# Patient Record
Sex: Female | Born: 1975
Health system: Southern US, Community
[De-identification: ages and names within clinical notes are randomized; demographics above are authoritative.]

## PROBLEM LIST (undated history)

## (undated) DIAGNOSIS — E559 Vitamin D deficiency, unspecified: Secondary | ICD-10-CM

## (undated) DIAGNOSIS — E119 Type 2 diabetes mellitus without complications: Secondary | ICD-10-CM

## (undated) DIAGNOSIS — E785 Hyperlipidemia, unspecified: Secondary | ICD-10-CM

## (undated) DIAGNOSIS — E282 Polycystic ovarian syndrome: Secondary | ICD-10-CM

## (undated) DIAGNOSIS — I10 Essential (primary) hypertension: Secondary | ICD-10-CM

## (undated) HISTORY — PX: OTHER SURGICAL HISTORY: SHX169

## (undated) HISTORY — DX: Hyperlipidemia, unspecified: E78.5

## (undated) HISTORY — DX: Type 2 diabetes mellitus without complications: E11.9

## (undated) HISTORY — DX: Vitamin D deficiency, unspecified: E55.9

## (undated) HISTORY — DX: Polycystic ovarian syndrome: E28.2

## (undated) HISTORY — DX: Essential (primary) hypertension: I10

## (undated) HISTORY — PX: COLONOSCOPY: SHX174

---

## 1998-09-02 ENCOUNTER — Other Ambulatory Visit: Admission: RE | Admit: 1998-09-02 | Discharge: 1998-09-02 | Payer: Self-pay | Admitting: *Deleted

## 2000-01-03 ENCOUNTER — Other Ambulatory Visit: Admission: RE | Admit: 2000-01-03 | Discharge: 2000-01-03 | Payer: Self-pay | Admitting: *Deleted

## 2001-01-08 ENCOUNTER — Other Ambulatory Visit: Admission: RE | Admit: 2001-01-08 | Discharge: 2001-01-08 | Payer: Self-pay | Admitting: Obstetrics and Gynecology

## 2002-02-15 ENCOUNTER — Other Ambulatory Visit: Admission: RE | Admit: 2002-02-15 | Discharge: 2002-02-15 | Payer: Self-pay | Admitting: Obstetrics and Gynecology

## 2003-05-15 ENCOUNTER — Other Ambulatory Visit: Admission: RE | Admit: 2003-05-15 | Discharge: 2003-05-15 | Payer: Self-pay | Admitting: Obstetrics and Gynecology

## 2009-06-06 DIAGNOSIS — E282 Polycystic ovarian syndrome: Secondary | ICD-10-CM

## 2009-06-06 HISTORY — DX: Polycystic ovarian syndrome: E28.2

## 2016-05-06 DIAGNOSIS — I10 Essential (primary) hypertension: Secondary | ICD-10-CM

## 2016-05-06 HISTORY — DX: Essential (primary) hypertension: I10

## 2016-05-24 ENCOUNTER — Encounter: Payer: Self-pay | Admitting: Physician Assistant

## 2016-05-24 ENCOUNTER — Ambulatory Visit (INDEPENDENT_AMBULATORY_CARE_PROVIDER_SITE_OTHER): Payer: 59 | Admitting: Physician Assistant

## 2016-05-24 VITALS — BP 153/94 | HR 92 | Ht 66.0 in | Wt 276.4 lb

## 2016-05-24 DIAGNOSIS — R42 Dizziness and giddiness: Secondary | ICD-10-CM | POA: Diagnosis not present

## 2016-05-24 DIAGNOSIS — R000 Tachycardia, unspecified: Secondary | ICD-10-CM | POA: Insufficient documentation

## 2016-05-24 DIAGNOSIS — I10 Essential (primary) hypertension: Secondary | ICD-10-CM

## 2016-05-24 MED ORDER — METOPROLOL TARTRATE 50 MG PO TABS: 50.0000 mg | ORAL_TABLET | Freq: Two times a day (BID) | ORAL | 3 refills | Status: DC

## 2016-05-24 NOTE — Patient Instructions (Addendum)
Medication Instructions:  Your physician has recommended you make the following change in your medication:  1-INCREASE Metoprolol 50 mg by mouth twice daily  Labwork: NONE  Testing/Procedures: Your physician has requested that you have an echocardiogram. Echocardiography is a painless test that uses sound waves to create images of your heart. It provides your doctor with information about the size and shape of your heart and how well your heart's chambers and valves are working. This procedure takes approximately one hour. There are no restrictions for this procedure.  Follow-Up: Your physician wants you to follow-up in: 3 months with Dr. SwazilandJordan.   If you need a refill on your cardiac medications before your next appointment, please call your pharmacy.

## 2016-05-24 NOTE — Progress Notes (Signed)
Cardiology Office Note   Date:  05/24/2016   ID:  Cindy Hooper, DOB 06/24/1975, MRN [id number]  PCP:  Levon Hedger, MD  Cardiologist:  Ellis Parents, Dr Swaziland  Hadessah Grennan, PA-C   Chief Complaint  Patient presents with  . New Patient (Initial Visit)  . Tachycardia  . Dizziness    occassioanlly.    History of Present Illness: Cindy Hooper is a 40 y.o. female with a history of PCOS, Vit D deficiency, recently diagnosed with HTN  Cindy Hooper presents for evaluation of tachycardia.   Pt had medium high resting HR all her life, 80s was normal.   About a month ago, she developed intermittent light-headed feelings. These could come on while sitting at her computer, or with exertion. She walks the dogs 30" per day but is otherwise not active. She will occasionally get symptoms walking the dogs, but less than 50% of the time.   She saw her PCP because of the dizziness episodes and her HR was noted to be in the 90s-100s at rest, higher than usual for her. Her ECG showed sinus tach, HR 122. She was started on metoprolol and referred to cards. Her CBC, TSH and Free T4 were checked and were all normal.  After being started on the metoprolol, she went to [employer]. She did a great deal of walking, she did well with this. No DOE, baseline HR lower, no chest pain. Reasonable exercise tolerance.   She has continued to do well since being on the metoprolol. She has had no more dizzy episodes and her resting HR is lower. No exertional symptoms. No LE edema, no orthopnea or PND. She does not feel her HR increases abnormally when she stands up.    Past Medical History:  Diagnosis Date  . Benign essential HTN 05/2016   New diagnosis as of 05/2016  . PCOS (polycystic ovarian syndrome) 2011  . Vitamin D deficiency     Past Surgical History:  Procedure Laterality Date  . None      Current Outpatient Prescriptions  Medication Sig Dispense Refill  . Cholecalciferol (VITAMIN D PO)  Take by mouth.    . metFORMIN (GLUCOPHAGE) 500 MG tablet Take by mouth 2 (two) times daily with a meal.    . metoprolol succinate (TOPROL-XL) 25 MG 24 hr tablet Take 25 mg by mouth 2 (two) times daily.    . Multiple Vitamin (MULTIVITAMIN) tablet Take 1 tablet by mouth daily.     No current facility-administered medications for this visit.     Allergies:   Patient has no known allergies.    Social History:  The patient  reports that she has never smoked. She has never used smokeless tobacco. She reports that she does not drink alcohol or use drugs.   Family History:  The patient's family history includes Alcohol abuse (age of onset: 23) in her father; CAD (age of onset: 23) in her mother; CAD (age of onset: 6) in her maternal grandmother; CVA (age of onset: 33) in her maternal grandfather; Cancer - Colon in her maternal grandfather and maternal grandmother.    ROS:  Please see the history of present illness. All other systems are reviewed and negative.    PHYSICAL EXAM: VS:  BP (!) 153/94   Pulse 92   Ht 5\' 6"  (1.676 m)   Wt 276 lb 6.4 oz (125.4 kg)   BMI 44.61 kg/m  , BMI Body mass index is 44.61 kg/m. GEN: Well nourished, well developed,  female in no acute distress  HEENT: normal for age  Neck: no JVD, no carotid bruit, no masses Cardiac: RRR; no murmur, no rubs, or gallops Respiratory:  clear to auscultation bilaterally, normal work of breathing GI: soft, nontender, nondistended, + BS MS: no deformity or atrophy; no edema; distal pulses are 2+ in all 4 extremities   Skin: warm and dry, no rash Neuro:  Strength and sensation are intact Psych: euthymic mood, full affect   EKG:  EKG is ordered today. The ekg ordered today demonstrates SR with nonspecific T wave flattening   Recent Labs: No results found for requested labs within last 8760 hours.    Lipid Panel No results found for: CHOL, TRIG, HDL, CHOLHDL, VLDL, LDLCALC, LDLDIRECT   Wt Readings from Last 3  Encounters:  05/24/16 276 lb 6.4 oz (125.4 kg)     Other studies Reviewed: Additional studies/ records that were reviewed today include: [facility] records faxed over.  ASSESSMENT AND PLAN: Plan reviewed with Dr Dr Swaziland  1.  Tachycardia: Only ST seen, this is associated with PCOS, which she has. PCP started metoprolol, this has resolved symptoms. HR still elevated, will increase to 50 mg bid and hopefully this will help HR and BP control. She is encouraged to increase activity as tolerated. If sx recur, can get monitor, for now do not think it is needed.  2. Dizziness: possibly related to rapid HR, resolved on BB, follow. With FH SCD in her mother, ck echo.  Current medicines are reviewed at length with the patient today.  The patient does not have concerns regarding medicines.  The following changes have been made:  Increase BB  Labs/ tests ordered today include:  Orders Placed This Encounter  Procedures  . EKG 12-Lead  . ECHOCARDIOGRAM COMPLETE     Disposition:   FU with Dr Swaziland  Signed, Theodore Demark, PA-C  05/24/2016 4:21 PM    [facility] Phone: [phone]; Fax: [phone]  This note was written with the assistance of speech recognition software. Please excuse any transcriptional errors.

## 2016-06-15 ENCOUNTER — Other Ambulatory Visit: Payer: Self-pay

## 2016-06-15 ENCOUNTER — Ambulatory Visit (HOSPITAL_COMMUNITY): Payer: 59 | Attending: Cardiovascular Disease

## 2016-06-15 DIAGNOSIS — R42 Dizziness and giddiness: Secondary | ICD-10-CM | POA: Diagnosis not present

## 2016-06-15 DIAGNOSIS — R000 Tachycardia, unspecified: Secondary | ICD-10-CM

## 2016-07-06 ENCOUNTER — Other Ambulatory Visit: Payer: Self-pay | Admitting: Internal Medicine

## 2016-07-06 DIAGNOSIS — R600 Localized edema: Secondary | ICD-10-CM

## 2016-07-07 ENCOUNTER — Ambulatory Visit
Admission: RE | Admit: 2016-07-07 | Discharge: 2016-07-07 | Disposition: A | Discharge status: Home / Self Care | Payer: 59 | Source: Ambulatory Visit | Attending: Internal Medicine | Admitting: Internal Medicine

## 2016-07-07 DIAGNOSIS — R600 Localized edema: Secondary | ICD-10-CM

## 2016-08-22 ENCOUNTER — Ambulatory Visit (INDEPENDENT_AMBULATORY_CARE_PROVIDER_SITE_OTHER): Payer: 59 | Admitting: Cardiology

## 2016-08-22 ENCOUNTER — Encounter: Payer: Self-pay | Admitting: Cardiology

## 2016-08-22 VITALS — BP 126/78 | HR 87 | Ht 66.0 in | Wt 270.0 lb

## 2016-08-22 DIAGNOSIS — R000 Tachycardia, unspecified: Secondary | ICD-10-CM | POA: Diagnosis not present

## 2016-08-22 DIAGNOSIS — I10 Essential (primary) hypertension: Secondary | ICD-10-CM | POA: Diagnosis not present

## 2016-08-22 MED ORDER — METOPROLOL SUCCINATE ER 100 MG PO TB24: 100.0000 mg | ORAL_TABLET | Freq: Every day | ORAL | 3 refills | Status: DC

## 2016-08-22 NOTE — Patient Instructions (Signed)
Switch metoprolol to Toprol XL 100 mg daily  I will see you in one year.

## 2016-08-22 NOTE — Progress Notes (Signed)
Cardiology Office Note    Date:  08/22/2016   ID:  Major Haegele, DOB December 09, 1975, MRN [id number]  PCP:  Levon Hedger, MD  Cardiologist:  Jaivion Kingsley Swaz[place]and, MD    History of Present Illness:  Cindy Hooper is a 41 y.o. female seen for follow up of sinus tachycardia. She has a history of HTN, PCOS, and Vit D deficiency. Seen initially by Theodore Demark PA-C on 05/24/16. Noted to have sinus tachycardia on Ecg up to 122 bpm. Complained of intermittent dizziness. Started on metoprolol and symptoms resolved. Echo showed normal LV systolic function and grade 2 diastolic dysfunction.   On follow up today she is doing very well. No significant palpitations since metoprolol was increased. She is interested in trying Toprol XL for convenience. No dyspnea, dizziness, or chest pain.    Past Medical History:  Diagnosis Date  . Benign essential HTN 05/2016   New diagnosis as of 05/2016  . PCOS (polycystic ovarian syndrome) 2011  . Vitamin D deficiency     Past Surgical History:  Procedure Laterality Date  . None      Current Medications: Outpatient Medications Prior to Visit  Medication Sig Dispense Refill  . Cholecalciferol (VITAMIN D PO) Take by mouth.    . metFORMIN (GLUCOPHAGE) 500 MG tablet Take by mouth 2 (two) times daily with a meal.    . metoprolol (LOPRESSOR) 50 MG tablet Take 1 tablet (50 mg total) by mouth 2 (two) times daily. 180 tablet 3  . Multiple Vitamin (MULTIVITAMIN) tablet Take 1 tablet by mouth daily.     No facility-administered medications prior to visit.      Allergies:   Patient has no known allergies.   Social History   Social History  . Marital status: Unknown    Spouse name: N/A  . Number of children: N/A  . Years of education: N/A   Occupational History  . 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 Smoking status: Never Smoker  . Smokeless tobacco: Never Used  . Alcohol use No  . Drug use: No  . Sexual activity: Not Asked   Other Topics  Concern  . None   Social History Narrative   Pt lives with fiance in [place].     Family History:  The patient's family history includes Alcohol abuse (age of onset: 66) in her father; CAD (age of onset: 36) in her mother; CAD (age of onset: 84) in her maternal grandmother; CVA (age of onset: 61) in her maternal grandfather; Cancer - Colon in her maternal grandfather and maternal grandmother.   ROS:   Please see the history of present illness.    ROS All other systems reviewed and are negative.   PHYSICAL EXAM:   VS:  BP 126/78   Pulse 87   Ht 5\' 6"  (1.676 m)   Wt 270 lb (122.5 kg)   BMI 43.58 kg/m    GEN: Well nourished, obese, in no acute distress  HEENT: normal  Neck: no JVD, carotid bruits, or masses Cardiac: RRR; no murmurs, rubs, or gallops,no edema  Respiratory:  clear to auscultation bilaterally, normal work of breathing GI: soft, nontender, nondistended, + BS MS: no deformity or atrophy  Skin: warm and dry, no rash Neuro:  Alert and Oriented x 3, Strength and sensation are intact Psych: euthymic mood, full affect  Wt Readings from Last 3 Encounters:  08/22/16 270 lb (122.5 kg)  05/24/16 276 lb 6.4 oz (125.4 kg)  Studies/Labs Reviewed:   EKG:  EKG is not ordered today.  The ekg ordered today demonstrates N/A  Recent Labs: No results found for requested labs within last 8760 hours.   Lipid Panel No results found for: CHOL, TRIG, HDL, CHOLHDL, VLDL, LDLCALC, LDLDIRECT  Labs dated 09/21/15: cholesterol 192, triglycerides 101, HDL 39, LDL 133. CMET normal Dated 05/04/16: CBC and TFTs normal  Additional studies/ records that were reviewed today include:  Echo 06/15/16:Study Conclusions  - Left ventricle: The cavity size was normal. Wall thickness was   normal. Systolic function was normal. The estimated ejection   fraction was in the range of 60% to 65%. Wall motion was normal;   there were no regional wall motion abnormalities. Features are    consistent with a pseudonormal left ventricular filling pattern,   with concomitant abnormal relaxation and increased filling   pressure (grade 2 diastolic dysfunction). - Mitral valve: Mildly to moderately calcified annulus. - Left atrium: The atrium was mildly dilated.  ASSESSMENT:    1. Sinus tachycardia      PLAN:  In order of problems listed above:  1. Doing well on metoprolol. Will switch to Toprol XL 100 mg daily 2. BP is well controlled. 3. She is concerned about her strong family history of CAD. Since she is asymptomatic we discussed focusing on risk factor management. She will have lipids repeated this year. May consider going on a statin. Regular exercise.   I will follow up in one year.    Medication Adjustments/Labs and Tests Ordered: Current medicines are reviewed at length with the patient today.  Concerns regarding medicines are outlined above.  Medication changes, Labs and Tests ordered today are listed in the Patient Instructions below. There are no Patient Instructions on file for this visit.   Signed, Ercel Normoyle Swaziland, MD  08/22/2016 2:55 PM    [facility] [address] [place], [place], [address] [phone]

## 2016-12-28 ENCOUNTER — Other Ambulatory Visit: Payer: Self-pay | Admitting: Cardiology

## 2017-01-30 ENCOUNTER — Other Ambulatory Visit: Payer: Self-pay | Admitting: Internal Medicine

## 2017-01-30 DIAGNOSIS — Z1231 Encounter for screening mammogram for malignant neoplasm of breast: Secondary | ICD-10-CM

## 2017-02-13 ENCOUNTER — Ambulatory Visit
Admission: RE | Admit: 2017-02-13 | Discharge: 2017-02-13 | Disposition: A | Discharge status: Home / Self Care | Payer: 59 | Source: Ambulatory Visit | Attending: Internal Medicine | Admitting: Internal Medicine

## 2017-02-13 DIAGNOSIS — Z1231 Encounter for screening mammogram for malignant neoplasm of breast: Secondary | ICD-10-CM

## 2017-09-23 ENCOUNTER — Other Ambulatory Visit: Payer: Self-pay | Admitting: Cardiology

## 2017-10-23 ENCOUNTER — Other Ambulatory Visit: Payer: Self-pay | Admitting: Cardiology

## 2017-10-23 MED ORDER — METOPROLOL SUCCINATE ER 100 MG PO TB24: 100.0000 mg | ORAL_TABLET | Freq: Every day | ORAL | 2 refills | Status: DC

## 2017-10-23 NOTE — Telephone Encounter (Signed)
Metoprolol filled per the patient request. She has been informed to keep her August appointment with Dr. Swaziland.

## 2018-01-12 ENCOUNTER — Encounter: Payer: Self-pay | Admitting: Cardiology

## 2018-01-28 NOTE — Progress Notes (Signed)
Cardiology Office Note    Date:  02/01/2018   ID:  Cindy Hooper, DOB 04/01/1976, MRN [id number]  PCP:  Kendrick Ranch, MD  Cardiologist:  Peter Swaziland, MD    History of Present Illness:  Cindy Hooper is a 42 y.o. female seen for follow up of sinus tachycardia. She has a history of HTN, PCOS, and Vit D deficiency. Seen initially by Theodore Demark PA-C on 05/24/16. Noted to have sinus tachycardia on Ecg up to 122 bpm. Complained of intermittent dizziness. Started on metoprolol and symptoms resolved. Echo showed normal LV systolic function and grade 2 diastolic dysfunction.   On follow up today she is doing very well. She states she feels great and wants to stay on metoprolol.  No dyspnea, dizziness, or chest pain. She walks 1.5 miles/day. No other health concerns.     Past Medical History:  Diagnosis Date  . Benign essential HTN 05/2016   New diagnosis as of 05/2016  . PCOS (polycystic ovarian syndrome) 2011  . Vitamin D deficiency     Past Surgical History:  Procedure Laterality Date  . None      Current Medications: Outpatient Medications Prior to Visit  Medication Sig Dispense Refill  . Cholecalciferol (VITAMIN D PO) Take by mouth.    . metFORMIN (GLUCOPHAGE) 500 MG tablet Take by mouth 2 (two) times daily with a meal.    . Multiple Vitamin (MULTIVITAMIN) tablet Take 1 tablet by mouth daily.    . metoprolol succinate (TOPROL-XL) 100 MG 24 hr tablet Take 1 tablet (100 mg total) by mouth daily. Take with or immediately following a meal. 30 tablet 2  . metoprolol succinate (TOPROL-XL) 100 MG 24 hr tablet TAKE 1 TABLET BY MOUTH ONCE DAILY WITH MEALS 90 tablet 0   No facility-administered medications prior to visit.      Allergies:   Patient has no known allergies.   Social History   Socioeconomic History  . Marital status: Unknown    Spouse name: Not on file  . Number of children: Not on file  . Years of education: Not on file  . Highest education level: Not  on file  Occupational History  . Occupation: Charity fundraiser for [employer]  Social Needs  . Financial resource strain: Not on file  . Food insecurity:    Worry: Not on file    Inability: Not on file  . Transportation needs:    Medical: Not on file    Non-medical: Not on file  Tobacco Use  . Smoking status: Never Smoker  . Smokeless tobacco: Never Used  Substance and Sexual Activity  . Alcohol use: No  . Drug use: No  . Sexual activity: Not on file  Lifestyle  . Physical activity:    Days per week: Not on file    Minutes per session: Not on file  . Stress: Not on file  Relationships  . Social connections:    Talks on phone: Not on file    Gets together: Not on file    Attends religious service: Not on file    Active member of club or organization: Not on file    Attends meetings of clubs or organizations: Not on file    Relationship status: Not on file  Other Topics Concern  . Not on file  Social History Narrative   Pt lives with fiance in [place].     Family History:  The patient's family history includes Alcohol abuse (age of onset: 38) in her  father; CAD (age of onset: 58) in her mother; CAD (age of onset: 24) in her maternal grandmother; CVA (age of onset: 57) in her maternal grandfather; Cancer - Colon in her maternal grandfather and maternal grandmother.   ROS:   Please see the history of present illness.    ROS All other systems reviewed and are negative.   PHYSICAL EXAM:   VS:  BP 140/76 (BP Location: Right Arm, Patient Position: Sitting, Cuff Size: Large)   Pulse 86   Ht 5\' 6"  (1.676 m)   Wt 263 lb (119.3 kg)   BMI 42.45 kg/m    GEN: Well nourished, obese, in no acute distress  HEENT:  PERRL, EOMI, sclera are clear. Oropharynx is clear. NECK:  No jugular venous distention, carotid upstroke brisk and symmetric, no bruits, no thyromegaly or adenopathy LUNGS:  Clear to auscultation bilaterally CHEST:  Unremarkable HEART:  RRR,  PMI not displaced or sustained,S1 and S2  within normal limits, no S3, no S4: no clicks, no rubs, no murmurs ABD:  Soft, nontender. BS +, no masses or bruits. No hepatomegaly, no splenomegaly EXT:  2 + pulses throughout, no edema, no cyanosis no clubbing SKIN:  Warm and dry.  No rashes NEURO:  Alert and oriented x 3. Cranial nerves II through XII intact. PSYCH:  Cognitively intact    Wt Readings from Last 3 Encounters:  02/01/18 263 lb (119.3 kg)  08/22/16 270 lb (122.5 kg)  05/24/16 276 lb 6.4 oz (125.4 kg)      Studies/Labs Reviewed:   EKG:  EKG is not ordered today.  The ekg ordered today demonstrates N/A  Recent Labs: No results found for requested labs within last 8760 hours.   Lipid Panel No results found for: CHOL, TRIG, HDL, CHOLHDL, VLDL, LDLCALC, LDLDIRECT  Labs dated 09/21/15: cholesterol 192, triglycerides 101, HDL 39, LDL 133. CMET normal Dated 05/04/16: CBC and TFTs normal Dated 01/30/17; cholesterol 194, triglycerides 121, HDL 37, LDL 133. Hgb, chemistries and TSH normal.  Ecg today shows NSR rate 86. Nonspecific TWA. I have personally reviewed and interpreted this study.   Additional studies/ records that were reviewed today include:  Echo 06/15/16:Study Conclusions  - Left ventricle: The cavity size was normal. Wall thickness was   normal. Systolic function was normal. The estimated ejection   fraction was in the range of 60% to 65%. Wall motion was normal;   there were no regional wall motion abnormalities. Features are   consistent with a pseudonormal left ventricular filling pattern,   with concomitant abnormal relaxation and increased filling   pressure (grade 2 diastolic dysfunction). - Mitral valve: Mildly to moderately calcified annulus. - Left atrium: The atrium was mildly dilated.  ASSESSMENT:    1. Sinus tachycardia      PLAN:  In order of problems listed above:  1. Doing very well on metoprolol. Toprol XL is refilled. 2. Mild hypercholesterolemia. Does not want to take  medication. Agree- focus on lifestyle modification.  I will follow up in one year.    Medication Adjustments/Labs and Tests Ordered: Current medicines are reviewed at length with the patient today.  Concerns regarding medicines are outlined above.  Medication changes, Labs and Tests ordered today are listed in the Patient Instructions below. Patient Instructions  Continue your current therapy  Follow up in one year    Signed, Peter Swaziland, MD  02/01/2018 6:01 PM    [facility] [address] [place], [place], [address] [phone]

## 2018-02-01 ENCOUNTER — Encounter: Payer: Self-pay | Admitting: Cardiology

## 2018-02-01 ENCOUNTER — Ambulatory Visit (INDEPENDENT_AMBULATORY_CARE_PROVIDER_SITE_OTHER): Payer: 59 | Admitting: Cardiology

## 2018-02-01 VITALS — BP 140/76 | HR 86 | Ht 66.0 in | Wt 263.0 lb

## 2018-02-01 DIAGNOSIS — R000 Tachycardia, unspecified: Secondary | ICD-10-CM

## 2018-02-01 MED ORDER — METOPROLOL SUCCINATE ER 100 MG PO TB24: 100.0000 mg | ORAL_TABLET | Freq: Every day | ORAL | 3 refills | Status: DC

## 2018-02-01 NOTE — Patient Instructions (Addendum)
Continue your current therapy  Follow up in one year 

## 2019-02-24 ENCOUNTER — Other Ambulatory Visit: Payer: Self-pay | Admitting: Cardiology

## 2019-02-26 ENCOUNTER — Other Ambulatory Visit: Payer: Self-pay | Admitting: Obstetrics & Gynecology

## 2019-02-26 DIAGNOSIS — Z1231 Encounter for screening mammogram for malignant neoplasm of breast: Secondary | ICD-10-CM

## 2019-04-10 ENCOUNTER — Other Ambulatory Visit: Payer: Self-pay

## 2019-04-10 ENCOUNTER — Ambulatory Visit
Admission: RE | Admit: 2019-04-10 | Discharge: 2019-04-10 | Disposition: A | Discharge status: Home / Self Care | Payer: 59 | Source: Ambulatory Visit | Attending: Obstetrics & Gynecology | Admitting: Obstetrics & Gynecology

## 2019-04-10 DIAGNOSIS — Z1231 Encounter for screening mammogram for malignant neoplasm of breast: Secondary | ICD-10-CM

## 2019-04-21 NOTE — Progress Notes (Signed)
Cardiology Office Note    Date:  04/23/2019   ID:  Cindy Hooper, DOB 12/02/1975, MRN [id number]  PCP:  Kendrick Ranch, MD  Cardiologist:  Dalene Robards [place], MD    History of Present Illness:  Cindy Hooper is a 43 y.o. female seen for follow up of sinus tachycardia. She has a history of HTN, PCOS, and Vit D deficiency. Seen initially by Theodore Demark PA-C on 05/24/16. Noted to have sinus tachycardia on Ecg up to 122 bpm. Complained of intermittent dizziness. Started on metoprolol and symptoms resolved. Echo showed normal LV systolic function and grade 2 diastolic dysfunction.   On follow up today she is doing very well. She states she feels great and wants to stay on metoprolol.  No dyspnea, dizziness, or chest pain. She does walk her dogs- 2 rescues. No other health concerns.     Past Medical History:  Diagnosis Date  . Benign essential HTN 05/2016   New diagnosis as of 05/2016  . PCOS (polycystic ovarian syndrome) 2011  . Vitamin D deficiency     Past Surgical History:  Procedure Laterality Date  . None      Current Medications: Outpatient Medications Prior to Visit  Medication Sig Dispense Refill  . Cholecalciferol (VITAMIN D PO) Take by mouth.    . metFORMIN (GLUCOPHAGE) 500 MG tablet Take by mouth 2 (two) times daily with a meal.    . Multiple Vitamin (MULTIVITAMIN) tablet Take 1 tablet by mouth daily.    . metoprolol succinate (TOPROL-XL) 100 MG 24 hr tablet TAKE 1 TABLET BY MOUTH ONCE DAILY WITH MEALS OR IMMEDIATELY FOLLOWING A MEAL **PATIENT NEEDS TO KEEP FOLLOW UP APPOINTMENT** 30 tablet 1   No facility-administered medications prior to visit.      Allergies:   Patient has no known allergies.   Social History   Socioeconomic History  . Marital status: Unknown    Spouse name: Not on file  . Number of children: Not on file  . Years of education: Not on file  . Highest education level: Not on file  Occupational History  . Occupation: Charity fundraiser for [employer]   Social Needs  . Financial resource strain: Not on file  . Food insecurity    Worry: Not on file    Inability: Not on file  . Transportation needs    Medical: Not on file    Non-medical: Not on file  Tobacco Use  . Smoking status: Never Smoker  . Smokeless tobacco: Never Used  Substance and Sexual Activity  . Alcohol use: No  . Drug use: No  . Sexual activity: Not on file  Lifestyle  . Physical activity    Days per week: Not on file    Minutes per session: Not on file  . Stress: Not on file  Relationships  . Social Musician on phone: Not on file    Gets together: Not on file    Attends religious service: Not on file    Active member of club or organization: Not on file    Attends meetings of clubs or organizations: Not on file    Relationship status: Not on file  Other Topics Concern  . Not on file  Social History Narrative   Pt lives with fiance in [place].     Family History:  The patient's family history includes Alcohol abuse (age of onset: 58) in her father; CAD (age of onset: 28) in her mother; CAD (age of onset: 57)  in her maternal grandmother; CVA (age of onset: 33) in her maternal grandfather; Cancer - Colon in her maternal grandfather and maternal grandmother.   ROS:   Please see the history of present illness.    ROS All other systems reviewed and are negative.   PHYSICAL EXAM:   VS:  BP 126/82   Pulse 98   Temp 98.1 F (36.7 C)   Ht 5\' 6"  (1.676 m)   Wt 263 lb (119.3 kg)   SpO2 97%   BMI 42.45 kg/m    GEN: Well nourished, obese, in no acute distress  HEENT:  PERRL, EOMI, sclera are clear. Oropharynx is clear. NECK:  No jugular venous distention, carotid upstroke brisk and symmetric, no bruits, no thyromegaly or adenopathy LUNGS:  Clear to auscultation bilaterally CHEST:  Unremarkable HEART:  RRR,  PMI not displaced or sustained,S1 and S2 within normal limits, no S3, no S4: no clicks, no rubs, no murmurs ABD:  Soft, nontender. BS +,  no masses or bruits. No hepatomegaly, no splenomegaly EXT:  2 + pulses throughout, no edema, no cyanosis no clubbing SKIN:  Warm and dry.  No rashes NEURO:  Alert and oriented x 3. Cranial nerves II through XII intact. PSYCH:  Cognitively intact    Wt Readings from Last 3 Encounters:  04/23/19 263 lb (119.3 kg)  02/01/18 263 lb (119.3 kg)  08/22/16 270 lb (122.5 kg)      Studies/Labs Reviewed:   EKG:  EKG is  ordered today.  The ekg ordered today demonstrates NSR rate 98. Nonspecific TWA.   Recent Labs: No results found for requested labs within last 8760 hours.   Lipid Panel No results found for: CHOL, TRIG, HDL, CHOLHDL, VLDL, LDLCALC, LDLDIRECT  Labs dated 09/21/15: cholesterol 192, triglycerides 101, HDL 39, LDL 133. CMET normal Dated 05/04/16: CBC and TFTs normal Dated 01/30/17; cholesterol 194, triglycerides 121, HDL 37, LDL 133. Hgb, chemistries and TSH normal. Dated 06/18/18: cholesterol 177, triglycerides 86, HDL 38, LDL 122. CBC, CMET, TSH normal  Ecg today shows NSR rate 86. Nonspecific TWA. I have personally reviewed and interpreted this study.   Additional studies/ records that were reviewed today include:  Echo 06/15/16:Study Conclusions  - Left ventricle: The cavity size was normal. Wall thickness was   normal. Systolic function was normal. The estimated ejection   fraction was in the range of 60% to 65%. Wall motion was normal;   there were no regional wall motion abnormalities. Features are   consistent with a pseudonormal left ventricular filling pattern,   with concomitant abnormal relaxation and increased filling   pressure (grade 2 diastolic dysfunction). - Mitral valve: Mildly to moderately calcified annulus. - Left atrium: The atrium was mildly dilated.  ASSESSMENT:    1. Sinus tachycardia   2. PCOS (polycystic ovarian syndrome)      PLAN:  In order of problems listed above:  1. Doing very well on metoprolol. Toprol XL is refilled. 2. Mild  hypercholesterolemia. Improved from before. Continue lifestyle modification 3. PCOS on metformin  I will follow up in 1-2 years.    Medication Adjustments/Labs and Tests Ordered: Current medicines are reviewed at length with the patient today.  Concerns regarding medicines are outlined above.  Medication changes, Labs and Tests ordered today are listed in the Patient Instructions below. There are no Patient Instructions on file for this visit.   Signed, Zaydon Kinser [place], MD  04/23/2019 3:45 PM    [facility] [address] [place],  [place], [address] [phone]

## 2019-04-23 ENCOUNTER — Other Ambulatory Visit: Payer: Self-pay

## 2019-04-23 ENCOUNTER — Ambulatory Visit (INDEPENDENT_AMBULATORY_CARE_PROVIDER_SITE_OTHER): Payer: 59 | Admitting: Cardiology

## 2019-04-23 ENCOUNTER — Encounter: Payer: Self-pay | Admitting: Cardiology

## 2019-04-23 VITALS — BP 126/82 | HR 98 | Temp 98.1°F | Ht 66.0 in | Wt 263.0 lb

## 2019-04-23 DIAGNOSIS — R000 Tachycardia, unspecified: Secondary | ICD-10-CM | POA: Diagnosis not present

## 2019-04-23 DIAGNOSIS — E282 Polycystic ovarian syndrome: Secondary | ICD-10-CM | POA: Diagnosis not present

## 2019-04-23 MED ORDER — METOPROLOL SUCCINATE ER 100 MG PO TB24: 100.0000 mg | ORAL_TABLET | Freq: Every day | ORAL | 3 refills | Status: DC

## 2019-04-23 NOTE — Addendum Note (Signed)
Addended by: Kathyrn Lass on: 04/23/2019 03:49 PM   Modules accepted: Orders

## 2020-03-30 ENCOUNTER — Other Ambulatory Visit: Payer: Self-pay | Admitting: Cardiology

## 2020-05-14 ENCOUNTER — Other Ambulatory Visit: Payer: Self-pay | Admitting: Family Medicine

## 2020-05-14 DIAGNOSIS — Z1231 Encounter for screening mammogram for malignant neoplasm of breast: Secondary | ICD-10-CM

## 2020-06-02 DIAGNOSIS — Z1231 Encounter for screening mammogram for malignant neoplasm of breast: Secondary | ICD-10-CM

## 2020-06-10 NOTE — Progress Notes (Signed)
Cardiology Office Note    Date:  06/12/2020   ID:  Cindy Hooper, DOB 1975-10-18, MRN [id number]  PCP:  Deatra James, MD  Cardiologist:  Jameson Morrow [place], MD    History of Present Illness:  Cindy Hooper is a 45 y.o. female seen for follow up of sinus tachycardia. She has a history of HTN, PCOS, and Vit D deficiency. Seen initially by Theodore Demark PA-C on 05/24/16. Noted to have sinus tachycardia on Ecg up to 122 bpm. Complained of intermittent dizziness. Started on metoprolol and symptoms resolved. Echo showed normal LV systolic function and grade 2 diastolic dysfunction.   On follow up today she is doing very well. She states she feels great and the Toprol is working well.   No dyspnea, dizziness, or chest pain. She walks regularly. She does have a family history of premature CAD with mother dying of sudden death at age 27 and father died of an MI at age 32.     Past Medical History:  Diagnosis Date  . Benign essential HTN 05/2016   New diagnosis as of 05/2016  . PCOS (polycystic ovarian syndrome) 2011  . Vitamin D deficiency     Past Surgical History:  Procedure Laterality Date  . None      Current Medications: Outpatient Medications Prior to Visit  Medication Sig Dispense Refill  . Cholecalciferol (VITAMIN D PO) Take by mouth.    . metFORMIN (GLUCOPHAGE) 500 MG tablet Take by mouth 2 (two) times daily with a meal.    . metoprolol succinate (TOPROL-XL) 100 MG 24 hr tablet TAKE 1 TABLET BY MOUTH ONCE DAILY AFTER A MEAL 90 tablet 1  . Multiple Vitamin (MULTIVITAMIN) tablet Take 1 tablet by mouth daily.     No facility-administered medications prior to visit.     Allergies:   Patient has no known allergies.   Social History   Socioeconomic History  . Marital status: Unknown    Spouse name: Not on file  . Number of children: Not on file  . Years of education: Not on file  . Highest education level: Not on file  Occupational History  . Occupation: Charity fundraiser for [employer]  Tobacco  Use  . Smoking status: Never Smoker  . Smokeless tobacco: Never Used  Vaping Use  . Vaping Use: Never used  Substance and Sexual Activity  . Alcohol use: No  . Drug use: No  . Sexual activity: Not on file  Other Topics Concern  . Not on file  Social History Narrative   Pt lives with fiance in [place].   Social Determinants of Health   Financial Resource Strain: Not on file  Food Insecurity: Not on file  Transportation Needs: Not on file  Physical Activity: Not on file  Stress: Not on file  Social Connections: Not on file     Family History:  The patient's family history includes Alcohol abuse (age of onset: 32) in her father; CAD (age of onset: 24) in her mother; CAD (age of onset: 84) in her maternal grandmother; CVA (age of onset: 58) in her maternal grandfather; Cancer - Colon in her maternal grandfather and maternal grandmother.   ROS:   Please see the history of present illness.    ROS All other systems reviewed and are negative.   PHYSICAL EXAM:   VS:  BP 132/72   Pulse 94   Ht 5\' 6"  (1.676 m)   Wt 267 lb 12.8 oz (121.5 kg)   BMI 43.22 kg/m  GEN: Well nourished, obese, in no acute distress  HEENT:  PERRL, EOMI, sclera are clear. Oropharynx is clear. NECK:  No jugular venous distention, carotid upstroke brisk and symmetric, no bruits, no thyromegaly or adenopathy LUNGS:  Clear to auscultation bilaterally CHEST:  Unremarkable HEART:  RRR,  PMI not displaced or sustained,S1 and S2 within normal limits, no S3, no S4: no clicks, no rubs, no murmurs ABD:  Soft, nontender. BS +, no masses or bruits. No hepatomegaly, no splenomegaly EXT:  2 + pulses throughout, no edema, no cyanosis no clubbing SKIN:  Warm and dry.  No rashes NEURO:  Alert and oriented x 3. Cranial nerves II through XII intact. PSYCH:  Cognitively intact    Wt Readings from Last 3 Encounters:  06/12/20 267 lb 12.8 oz (121.5 kg)  04/23/19 263 lb (119.3 kg)  02/01/18 263 lb (119.3 kg)       Studies/Labs Reviewed:   EKG:  EKG is  ordered today.  The ekg ordered today demonstrates NSR rate 94. Nonspecific TWA.   Recent Labs: No results found for requested labs within last 8760 hours.   Lipid Panel No results found for: CHOL, TRIG, HDL, CHOLHDL, VLDL, LDLCALC, LDLDIRECT  Labs dated 09/21/15: cholesterol 192, triglycerides 101, HDL 39, LDL 133. CMET normal Dated 05/04/16: CBC and TFTs normal Dated 01/30/17; cholesterol 194, triglycerides 121, HDL 37, LDL 133. Hgb, chemistries and TSH normal. Dated 06/18/18: cholesterol 177, triglycerides 86, HDL 38, LDL 122. CBC, CMET, TSH normal Dated 08/26/19: cholesterol 214, triglycerides 123, HDL 41, LDL 151. CBC, CMET and TSH normal.   Additional studies/ records that were reviewed today include:  Echo 06/15/16:Study Conclusions  - Left ventricle: The cavity size was normal. Wall thickness was   normal. Systolic function was normal. The estimated ejection   fraction was in the range of 60% to 65%. Wall motion was normal;   there were no regional wall motion abnormalities. Features are   consistent with a pseudonormal left ventricular filling pattern,   with concomitant abnormal relaxation and increased filling   pressure (grade 2 diastolic dysfunction). - Mitral valve: Mildly to moderately calcified annulus. - Left atrium: The atrium was mildly dilated.  ASSESSMENT:    1. Sinus tachycardia   2. PCOS (polycystic ovarian syndrome)   3. Hypercholesteremia   4. Family history of premature CAD      PLAN:  In order of problems listed above:  1. Doing very well on metoprolol. Toprol XL is refilled. 2. Hypercholesterolemia. We discussed whether or not she should go on statin therapy. She has no known CAD but family history is concerning. I have recommended a coronary CT calcium score. If her score is zero then her 10 year CV risk is very low and we can forgo statin therapy. If she does have coronary calcification then this would  argue for statin therapy and more aggressive lipid lowering.  3. PCOS on metformin- stable.   I will follow up in 1 years.    Medication Adjustments/Labs and Tests Ordered: Current medicines are reviewed at length with the patient today.  Concerns regarding medicines are outlined above.  Medication changes, Labs and Tests ordered today are listed in the Patient Instructions below. There are no Patient Instructions on file for this visit.   Signed, Lorilyn Laitinen [place], MD  06/12/2020 4:05 PM    [facility] [address], [place], [place], [address] [phone]

## 2020-06-12 ENCOUNTER — Other Ambulatory Visit: Payer: Self-pay

## 2020-06-12 ENCOUNTER — Ambulatory Visit (INDEPENDENT_AMBULATORY_CARE_PROVIDER_SITE_OTHER): Payer: 59 | Admitting: Cardiology

## 2020-06-12 ENCOUNTER — Encounter: Payer: Self-pay | Admitting: Cardiology

## 2020-06-12 VITALS — BP 132/72 | HR 94 | Ht 66.0 in | Wt 267.8 lb

## 2020-06-12 DIAGNOSIS — R000 Tachycardia, unspecified: Secondary | ICD-10-CM | POA: Diagnosis not present

## 2020-06-12 DIAGNOSIS — E7800 Pure hypercholesterolemia, unspecified: Secondary | ICD-10-CM | POA: Diagnosis not present

## 2020-06-12 DIAGNOSIS — E282 Polycystic ovarian syndrome: Secondary | ICD-10-CM

## 2020-06-12 DIAGNOSIS — Z8249 Family history of ischemic heart disease and other diseases of the circulatory system: Secondary | ICD-10-CM

## 2020-06-12 MED ORDER — METOPROLOL SUCCINATE ER 100 MG PO TB24: 100.0000 mg | ORAL_TABLET | Freq: Every day | ORAL | 3 refills | Status: DC

## 2020-06-12 NOTE — Patient Instructions (Signed)
Medication Instructions:  Continue same medications *If you need a refill on your cardiac medications before your next appointment, please call your pharmacy*   Lab Work: None ordered   Testing/Procedures: Coronary Calcium Score   Follow-Up: At [facility], you and your health needs are our priority.  As part of our continuing mission to provide you with exceptional heart care, we have created designated Provider Care Teams.  These Care Teams include your primary Cardiologist (physician) and Advanced Practice Providers (APPs -  Physician Assistants and Nurse Practitioners) who all work together to provide you with the care you need, when you need it.  We recommend signing up for the patient portal called "MyChart".  Sign up information is provided on this After Visit Summary.  MyChart is used to connect with patients for Virtual Visits (Telemedicine).  Patients are able to view lab/test results, encounter notes, upcoming appointments, etc.  Non-urgent messages can be sent to your provider as well.   To learn more about what you can do with MyChart, go to [email or web address].    Your next appointment:  1 year   Call in Oct to schedule Jan appointment    The format for your next appointment:  Office    Provider:  Dr.Jordan

## 2020-06-18 ENCOUNTER — Other Ambulatory Visit: Payer: Self-pay

## 2020-06-18 ENCOUNTER — Ambulatory Visit (INDEPENDENT_AMBULATORY_CARE_PROVIDER_SITE_OTHER)
Admission: RE | Admit: 2020-06-18 | Discharge: 2020-06-18 | Disposition: A | Discharge status: Home / Self Care | Payer: Self-pay | Source: Ambulatory Visit | Attending: Cardiology | Admitting: Cardiology

## 2020-06-18 DIAGNOSIS — Z8249 Family history of ischemic heart disease and other diseases of the circulatory system: Secondary | ICD-10-CM

## 2020-06-18 DIAGNOSIS — E7800 Pure hypercholesterolemia, unspecified: Secondary | ICD-10-CM

## 2020-06-18 DIAGNOSIS — R000 Tachycardia, unspecified: Secondary | ICD-10-CM

## 2020-06-18 DIAGNOSIS — E282 Polycystic ovarian syndrome: Secondary | ICD-10-CM

## 2020-08-17 ENCOUNTER — Ambulatory Visit
Admission: RE | Admit: 2020-08-17 | Discharge: 2020-08-17 | Disposition: A | Discharge status: Home / Self Care | Payer: 59 | Source: Ambulatory Visit | Attending: Family Medicine | Admitting: Family Medicine

## 2020-08-17 ENCOUNTER — Other Ambulatory Visit: Payer: Self-pay

## 2020-08-17 DIAGNOSIS — Z1231 Encounter for screening mammogram for malignant neoplasm of breast: Secondary | ICD-10-CM

## 2021-04-16 ENCOUNTER — Other Ambulatory Visit: Payer: Self-pay

## 2021-04-16 ENCOUNTER — Encounter: Payer: Self-pay | Admitting: Plastic Surgery

## 2021-04-16 ENCOUNTER — Ambulatory Visit (INDEPENDENT_AMBULATORY_CARE_PROVIDER_SITE_OTHER): Payer: 59 | Admitting: Plastic Surgery

## 2021-04-16 DIAGNOSIS — L989 Disorder of the skin and subcutaneous tissue, unspecified: Secondary | ICD-10-CM | POA: Diagnosis not present

## 2021-04-16 DIAGNOSIS — L719 Rosacea, unspecified: Secondary | ICD-10-CM

## 2021-04-16 NOTE — Progress Notes (Signed)
Patient ID: Cindy Hooper, female    DOB: 1975-06-08, 45 y.o.   MRN: [id number]   Chief Complaint  Patient presents with   Advice Only   Skin Problem    The patient is a 45 year old female here for evaluation of her face.  She has several changing skin lesions.  She says that they have been there for several years and they are starting to get larger.  She is concerned about the change.  She does not have a family history or personal history of skin cancer.  She also has pretty severe rosacea and is interested in treatment.  I do not see any other areas of concern.  She is otherwise healthy.  She has had laser treatment in the past for her tourism secondary to polycystic ovary disease.  The largest lesion is in the left periorbital area medially.  It is about 7 mm in size and is flesh-colored and raised.  In the right perioral lesion is 5 to 6 mm, raised and flesh-colored.  The lesion on the left chin is raised with hyperpigmentation in the center.  Nothing makes these areas better.   Review of Systems  Constitutional: Negative.   Eyes: Negative.   Respiratory: Negative.    Cardiovascular: Negative.   Gastrointestinal: Negative.   Endocrine: Negative.   Genitourinary: Negative.   Musculoskeletal: Negative.   Skin:  Positive for color change.  Psychiatric/Behavioral: Negative.     Past Medical History:  Diagnosis Date   Benign essential HTN 05/2016   New diagnosis as of 05/2016   PCOS (polycystic ovarian syndrome) 2011   Vitamin D deficiency     Past Surgical History:  Procedure Laterality Date   None        Current Outpatient Medications:    Cholecalciferol (VITAMIN D PO), Take by mouth., Disp: , Rfl:    metFORMIN (GLUCOPHAGE) 500 MG tablet, Take by mouth 2 (two) times daily with a meal., Disp: , Rfl:    metoprolol succinate (TOPROL-XL) 100 MG 24 hr tablet, Take 1 tablet (100 mg total) by mouth daily. Take with or immediately following a meal., Disp: 90 tablet, Rfl: 3    Multiple Vitamin (MULTIVITAMIN) tablet, Take 1 tablet by mouth daily., Disp: , Rfl:    tretinoin (RETIN-A) 0.025 % cream, SMARTSIG:Topical Every Evening, Disp: , Rfl:    Objective:   Vitals:   04/16/21 1114  BP: 120/75  Pulse: (!) 105  SpO2: 98%    Physical Exam Vitals and nursing note reviewed.  Constitutional:      Appearance: Normal appearance.  HENT:     Head: Normocephalic and atraumatic.  Cardiovascular:     Rate and Rhythm: Normal rate.     Pulses: Normal pulses.  Pulmonary:     Effort: Pulmonary effort is normal.  Musculoskeletal:        General: No swelling or deformity.  Skin:    Capillary Refill: Capillary refill takes less than 2 seconds.     Coloration: Skin is not jaundiced.     Findings: Lesion present. No bruising.  Neurological:     Mental Status: She is alert and oriented to person, place, and time.    Assessment & Plan:  Changing skin lesion  Rosacea  I recommend excision of the chin changing skin lesion and sent to pathology.  If it is negative then I would feel comfortable excising the other 2 areas for fee-for-service.  I will also send her information for the BBL laser  for her rosacea.   Pictures were obtained of the patient and placed in the chart with the patient's or guardian's permission.   Alena Bills Montrell Cessna, DO

## 2021-06-20 ENCOUNTER — Encounter: Payer: Self-pay | Admitting: Cardiology

## 2021-06-21 MED ORDER — METOPROLOL SUCCINATE ER 100 MG PO TB24: 100.0000 mg | ORAL_TABLET | Freq: Every day | ORAL | 0 refills | Status: DC

## 2021-06-22 ENCOUNTER — Other Ambulatory Visit: Payer: Self-pay | Admitting: Cardiology

## 2021-06-25 ENCOUNTER — Telehealth: Payer: Self-pay | Admitting: Cardiology

## 2021-06-25 ENCOUNTER — Telehealth: Payer: Self-pay

## 2021-06-25 MED ORDER — METOPROLOL SUCCINATE ER 100 MG PO TB24: 100.0000 mg | ORAL_TABLET | Freq: Every day | ORAL | 0 refills | Status: DC

## 2021-06-25 NOTE — Telephone Encounter (Signed)
° ° °*  STAT* If patient is at the pharmacy, call can be transferred to refill team.   1. Which medications need to be refilled? (please list name of each medication and dose if known)  metoprolol succinate (TOPROL-XL) 100 MG 24 hr tablet  2. Which pharmacy/location (including street and city if local pharmacy) is medication to be sent to? [facility] - [place], [place] - [address]  3. Do they need a 30 day or 90 day supply? 30 with refills    Patient does not use [facility] any longer. The [facility] but there was a technical error so a new rx needs sent

## 2021-06-25 NOTE — Telephone Encounter (Signed)
Received a call from pharmacist with [facility] [address].Advised ok to refill Metoprolol.Advised patient needs to keep appointment already scheduled with Dr.Jordan 08/17/21.

## 2021-07-27 ENCOUNTER — Other Ambulatory Visit (HOSPITAL_COMMUNITY)
Admission: RE | Admit: 2021-07-27 | Discharge: 2021-07-27 | Disposition: A | Discharge status: Home / Self Care | Payer: 59 | Source: Ambulatory Visit | Attending: Plastic Surgery | Admitting: Plastic Surgery

## 2021-07-27 ENCOUNTER — Other Ambulatory Visit: Payer: Self-pay

## 2021-07-27 ENCOUNTER — Other Ambulatory Visit: Payer: Self-pay | Admitting: Family Medicine

## 2021-07-27 ENCOUNTER — Encounter: Payer: Self-pay | Admitting: Plastic Surgery

## 2021-07-27 ENCOUNTER — Ambulatory Visit (INDEPENDENT_AMBULATORY_CARE_PROVIDER_SITE_OTHER): Payer: 59 | Admitting: Plastic Surgery

## 2021-07-27 VITALS — BP 129/84 | HR 95

## 2021-07-27 DIAGNOSIS — L989 Disorder of the skin and subcutaneous tissue, unspecified: Secondary | ICD-10-CM

## 2021-07-27 DIAGNOSIS — Z1231 Encounter for screening mammogram for malignant neoplasm of breast: Secondary | ICD-10-CM

## 2021-07-27 NOTE — Progress Notes (Signed)
Procedure Note  Preoperative Dx: changing skin lesion chin  Postoperative Dx: Same  Procedure: Excision of changing skin lesion of chin 9 mm  Anesthesia: Lidocaine 1% with 1:100,000 epinephrine   Description of Procedure: Risks and complications were explained to the patient.  Consent was confirmed and the patient understands the risks and benefits.  The potential complications and alternatives were explained and the patient consents.  The patient expressed understanding the option of not having the procedure and the risks of a scar.  Time out was called and all information was confirmed to be correct.    The area was prepped and drapped.  Lidocaine 1% with epinepherine was injected in the subcutaneous area.  After waiting several minutes for the local to take affect a #15 blade was used to excise the area in an eliptical pattern.  The skin edges were reapproximated with 6-0 Monocryl subcuticular running closure.  A dressing was applied.  The patient was given instructions on how to care for the area and a follow up appointment.  Cindy Hooper tolerated the procedure well and there were no complications. The specimen sent to pathology.

## 2021-07-29 LAB — SURGICAL PATHOLOGY

## 2021-07-30 ENCOUNTER — Telehealth: Payer: Self-pay | Admitting: *Deleted

## 2021-07-30 NOTE — Telephone Encounter (Signed)
-----   Message from Peggye Form, DO sent at 07/29/2021  8:04 AM EST ----- Please let pt know benign ----- Message ----- From: Interface, Lab In Three Zero One Sent: 07/29/2021   7:56 AM EST To: Alena Bills Dillingham, DO

## 2021-07-30 NOTE — Telephone Encounter (Signed)
Called and spoke with the patient and informed her of her recent Surgical pathology results.   Informed the patient that her results were benign.  Patient verbalized understanding and agreed.//AB/CMA

## 2021-08-07 ENCOUNTER — Telehealth: Payer: Self-pay | Admitting: Plastic Surgery

## 2021-08-07 MED ORDER — DOXYCYCLINE HYCLATE 100 MG PO TABS: 100.0000 mg | ORAL_TABLET | Freq: Two times a day (BID) | ORAL | 0 refills | Status: AC

## 2021-08-07 MED ORDER — VALACYCLOVIR HCL 1 G PO TABS: 2000.0000 mg | ORAL_TABLET | Freq: Two times a day (BID) | ORAL | 0 refills | Status: AC

## 2021-08-07 NOTE — Telephone Encounter (Signed)
Pt noted to have itching/burning and redness under steristrips.  Stable with no fevers.  Differential includes hypersensitivity to adhesive, early cellulitis, HSV.  Doxycycline and acyclovir rx sent.  Patient to contact me again if any worsening. ?

## 2021-08-10 ENCOUNTER — Ambulatory Visit (INDEPENDENT_AMBULATORY_CARE_PROVIDER_SITE_OTHER): Payer: 59 | Admitting: Surgical

## 2021-08-10 ENCOUNTER — Other Ambulatory Visit: Payer: Self-pay

## 2021-08-10 DIAGNOSIS — L989 Disorder of the skin and subcutaneous tissue, unspecified: Secondary | ICD-10-CM

## 2021-08-10 DIAGNOSIS — L719 Rosacea, unspecified: Secondary | ICD-10-CM

## 2021-08-10 MED ORDER — VALACYCLOVIR HCL 1 G PO TABS: 1000.0000 mg | ORAL_TABLET | Freq: Two times a day (BID) | ORAL | 0 refills | Status: AC

## 2021-08-10 NOTE — Progress Notes (Signed)
46 year old female here for follow-up after excision of changing skin lesion of her chin with Dr. Ulice Bold on 07/27/2021.  The skin edges were closed with 6-0 Monocryl. ? 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  ? 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  She was subsequently prescribed doxycycline and Valtrex from on-call provider.  She reports after starting the Valtrex she is doing much better, she has not started the doxycycline yet. ? ?She reports she has noticed an improvement since starting the Valtrex, feels as if the area has "dried up".  She reports she is still having tingling sensations in this area. ? ?On exam left chin incision appears to be irritated, scabbing.  Incision site appears to have a breakout consistent with HSV 1.  There is no surrounding erythema, no cellulitic changes, no foul odor is noted. ? ?Additional 3 days of Valtrex sent to patient's pharmacy, recommend completing this course. ?Recommend following up in 1 to 2 weeks for reevaluation.  Picture was taken and placed in the patient's chart with patient's permission.  Recommend calling evokes symptoms worsen or do not improve. ?

## 2021-08-12 NOTE — Progress Notes (Unsigned)
Cardiology Office Note    Date:  08/12/2021   ID:  YEILIN RAPALO, DOB 08/24/1975, MRN [id number]  PCP:  Deatra James, MD  Cardiologist:  Depaul Arizpe [place], MD    History of Present Illness:  Cindy Hooper is a 46 y.o. female seen for follow up of sinus tachycardia. She has a history of HTN, PCOS, and Vit D deficiency. Seen initially by Theodore Demark PA-C on 05/24/16. Noted to have sinus tachycardia on Ecg up to 122 bpm. Complained of intermittent dizziness. Started on metoprolol and symptoms resolved. Echo showed normal LV systolic function and grade 2 diastolic dysfunction.   On prior visit we obtained a coronary calcium score and it was 0.   On follow up today she is doing very well. She states she feels great and the Toprol is working well.   No dyspnea, dizziness, or chest pain. She walks regularly. She does have a family history of premature CAD with mother dying of sudden death at age 16 and father died of an MI at age 49.     Past Medical History:  Diagnosis Date   Benign essential HTN 05/2016   New diagnosis as of 05/2016   PCOS (polycystic ovarian syndrome) 2011   Vitamin D deficiency     Past Surgical History:  Procedure Laterality Date   None      Current Medications: Outpatient Medications Prior to Visit  Medication Sig Dispense Refill   Cholecalciferol (VITAMIN D PO) Take by mouth.     doxycycline (VIBRA-TABS) 100 MG tablet Take 1 tablet (100 mg total) by mouth 2 (two) times daily for 10 days. 20 tablet 0   metFORMIN (GLUCOPHAGE) 500 MG tablet Take by mouth 2 (two) times daily with a meal.     metoprolol succinate (TOPROL-XL) 100 MG 24 hr tablet Take 1 tablet (100 mg total) by mouth daily. Take with or immediately following a meal. 90 tablet 0   Multiple Vitamin (MULTIVITAMIN) tablet Take 1 tablet by mouth daily.     tretinoin (RETIN-A) 0.025 % cream SMARTSIG:Topical Every Evening     valACYclovir (VALTREX) 1000 MG tablet Take 1 tablet (1,000 mg total) by mouth 2  (two) times daily for 3 days. 6 tablet 0   No facility-administered medications prior to visit.     Allergies:   Patient has no known allergies.   Social History   Socioeconomic History   Marital status: Married    Spouse name: Not on file   Number of children: Not on file   Years of education: Not on file   Highest education level: Not on file  Occupational History   Occupation: Charity fundraiser for [employer]  Tobacco Use   Smoking status: Never   Smokeless tobacco: Never  Vaping Use   Vaping Use: Never used  Substance and Sexual Activity   Alcohol use: No   Drug use: No   Sexual activity: Not on file  Other Topics Concern   Not on file  Social History Narrative   Pt lives with fiance in [place].   Social Determinants of Health   Financial Resource Strain: Not on file  Food Insecurity: Not on file  Transportation Needs: Not on file  Physical Activity: Not on file  Stress: Not on file  Social Connections: Not on file     Family History:  The patient's family history includes Alcohol abuse (age of onset: 20) in her father; CAD (age of onset: 21) in her mother; CAD (age of onset:  53) in her maternal grandmother; CVA (age of onset: 9) in her maternal grandfather; Cancer - Colon in her maternal grandfather and maternal grandmother.   ROS:   Please see the history of present illness.    ROS All other systems reviewed and are negative.   PHYSICAL EXAM:   VS:  There were no vitals taken for this visit.   GEN: Well nourished, obese, in no acute distress  HEENT:  PERRL, EOMI, sclera are clear. Oropharynx is clear. NECK:  No jugular venous distention, carotid upstroke brisk and symmetric, no bruits, no thyromegaly or adenopathy LUNGS:  Clear to auscultation bilaterally CHEST:  Unremarkable HEART:  RRR,  PMI not displaced or sustained,S1 and S2 within normal limits, no S3, no S4: no clicks, no rubs, no murmurs ABD:  Soft, nontender. BS +, no masses or bruits. No hepatomegaly, no  splenomegaly EXT:  2 + pulses throughout, no edema, no cyanosis no clubbing SKIN:  Warm and dry.  No rashes NEURO:  Alert and oriented x 3. Cranial nerves II through XII intact. PSYCH:  Cognitively intact    Wt Readings from Last 3 Encounters:  04/16/21 257 lb (116.6 kg)  06/12/20 267 lb 12.8 oz (121.5 kg)  04/23/19 263 lb (119.3 kg)      Studies/Labs Reviewed:   EKG:  EKG is  ordered today.  The ekg ordered today demonstrates NSR rate 94. Nonspecific TWA.   Recent Labs: No results found for requested labs within last 8760 hours.   Lipid Panel No results found for: CHOL, TRIG, HDL, CHOLHDL, VLDL, LDLCALC, LDLDIRECT  Labs dated 09/21/15: cholesterol 192, triglycerides 101, HDL 39, LDL 133. CMET normal Dated 05/04/16: CBC and TFTs normal Dated 01/30/17; cholesterol 194, triglycerides 121, HDL 37, LDL 133. Hgb, chemistries and TSH normal. Dated 06/18/18: cholesterol 177, triglycerides 86, HDL 38, LDL 122. CBC, CMET, TSH normal Dated 08/26/19: cholesterol 214, triglycerides 123, HDL 41, LDL 151. CBC, CMET and TSH normal. Dated 08/31/20: cholesterol 187, triglycerides 112, HDL 38, LDL 129. A1c 6.8%. ALT 66, AST 36. Otherwise CMET, CBC, TSH normal.  Additional studies/ records that were reviewed today include:  Echo 06/15/16:Study Conclusions   - Left ventricle: The cavity size was normal. Wall thickness was   normal. Systolic function was normal. The estimated ejection   fraction was in the range of 60% to 65%. Wall motion was normal;   there were no regional wall motion abnormalities. Features are   consistent with a pseudonormal left ventricular filling pattern,   with concomitant abnormal relaxation and increased filling   pressure (grade 2 diastolic dysfunction). - Mitral valve: Mildly to moderately calcified annulus. - Left atrium: The atrium was mildly dilated.  ASSESSMENT:    No diagnosis found.    PLAN:  In order of problems listed above:  Doing very well on  metoprolol. Toprol XL is refilled. Hypercholesterolemia. We discussed whether or not she should go on statin therapy. Coronary calcium score is 0.  PCOS on metformin- stable.   I will follow up in 1 years.    Medication Adjustments/Labs and Tests Ordered: Current medicines are reviewed at length with the patient today.  Concerns regarding medicines are outlined above.  Medication changes, Labs and Tests ordered today are listed in the Patient Instructions below. There are no Patient Instructions on file for this visit.   Signed, Sayre Mazor [place], MD  08/12/2021 8:07 AM    [facility] [address] [place], [place], [address] [phone]

## 2021-08-17 ENCOUNTER — Ambulatory Visit (INDEPENDENT_AMBULATORY_CARE_PROVIDER_SITE_OTHER): Payer: 59 | Admitting: Cardiology

## 2021-08-17 ENCOUNTER — Other Ambulatory Visit: Payer: Self-pay

## 2021-08-17 ENCOUNTER — Encounter: Payer: Self-pay | Admitting: Cardiology

## 2021-08-17 VITALS — BP 140/80 | HR 93 | Ht 67.0 in | Wt 259.4 lb

## 2021-08-17 DIAGNOSIS — E7800 Pure hypercholesterolemia, unspecified: Secondary | ICD-10-CM | POA: Diagnosis not present

## 2021-08-17 DIAGNOSIS — R000 Tachycardia, unspecified: Secondary | ICD-10-CM

## 2021-08-17 DIAGNOSIS — E282 Polycystic ovarian syndrome: Secondary | ICD-10-CM | POA: Diagnosis not present

## 2021-08-17 MED ORDER — METOPROLOL SUCCINATE ER 100 MG PO TB24: 100.0000 mg | ORAL_TABLET | Freq: Every day | ORAL | 3 refills | Status: DC

## 2021-08-17 NOTE — Progress Notes (Signed)
Patient is a 46 year old female with PMH of changing skin lesion on her chin s/p office excision performed 07/27/2021 by Dr. Marla Roe who presents to clinic for postprocedural follow-up. ? ?Patient was last seen on 08/10/2021.  Per procedural note, skin edges had been approximated with 6-0 Monocryl running closure.  At that time, pathology which revealed benign intradermal nevus was reviewed with patient.  Unfortunately her recovery was complicated by breakout of HSV 1 for which she had been prescribed doxycycline and Valtrex.  Patient reported that it felt as though it was improving, however appeared irritated on exam.  Additional Valtrex was prescribed and she was encouraged to return to clinic in 1 to 2 weeks for reevaluation.   ? ?Today, patient is doing well.  She still has persistent herpetic lesion at excision site, but she states that this is not uncommon.  She often will have breakouts that last a month.  She usually is dosed prophylactically had of dental procedures and had not considered prophylaxis for her excision.  She plans to do so for her subsequent skin lesion excisions.  Patient also is planning laser treatment for her rosacea and will request Valtrex prophylaxis.  She had a residual suture in place, but it fell out the other day.  She reports the excision site itself is healed up nicely.  She continues to use Abreva. ? ?Physical exam is reassuring.  No persistent sutures noted.  Herpetic lesion noted.  No surrounding erythema or concern for infection. ? ?No specific follow-up needed.  Patient is not particularly bothered or surprised by her persistent herpetic lesion and simply plans to prophylactically treat before subsequent skin lesion excisions.  She has laser scheduled with Dr. Marla Roe next month.  She will call the clinic should she have any questions or concerns in the interim. ? ? ?

## 2021-08-18 ENCOUNTER — Ambulatory Visit
Admission: RE | Admit: 2021-08-18 | Discharge: 2021-08-18 | Disposition: A | Discharge status: Home / Self Care | Payer: 59 | Source: Ambulatory Visit | Attending: Family Medicine | Admitting: Family Medicine

## 2021-08-18 DIAGNOSIS — Z1231 Encounter for screening mammogram for malignant neoplasm of breast: Secondary | ICD-10-CM

## 2021-08-19 ENCOUNTER — Other Ambulatory Visit: Payer: Self-pay

## 2021-08-19 ENCOUNTER — Ambulatory Visit (INDEPENDENT_AMBULATORY_CARE_PROVIDER_SITE_OTHER): Payer: 59 | Admitting: Physician Assistant

## 2021-08-19 DIAGNOSIS — L989 Disorder of the skin and subcutaneous tissue, unspecified: Secondary | ICD-10-CM

## 2021-09-19 ENCOUNTER — Other Ambulatory Visit: Payer: Self-pay | Admitting: Cardiology

## 2021-09-21 ENCOUNTER — Ambulatory Visit (INDEPENDENT_AMBULATORY_CARE_PROVIDER_SITE_OTHER): Payer: Self-pay | Admitting: Plastic Surgery

## 2021-09-21 DIAGNOSIS — L719 Rosacea, unspecified: Secondary | ICD-10-CM

## 2021-09-21 NOTE — Progress Notes (Signed)
[place] ?Preoperative Dx: hyperpigmentation of face ? ?Postoperative Dx:  same ? ?Procedure: laser to face  ? ?Anesthesia: none ? ?Description of Procedure:  ?Risks and complications were explained to the patient. Consent was confirmed and signed. Eye protection was placed. Time out was called and all information was confirmed to be correct. The area  area was prepped with alcohol and wiped dry. The BBL laser was set at 560 nm 7 J/cm2. Then 515 nm at 6.5 J. The face was lasered. The patient tolerated the procedure well and there were no complications. The patient is to follow up in 4 weeks. ? ? ?

## 2021-09-25 ENCOUNTER — Other Ambulatory Visit: Payer: Self-pay | Admitting: Cardiology

## 2021-10-12 ENCOUNTER — Ambulatory Visit (INDEPENDENT_AMBULATORY_CARE_PROVIDER_SITE_OTHER): Payer: Self-pay | Admitting: Plastic Surgery

## 2021-10-12 ENCOUNTER — Encounter: Payer: Self-pay | Admitting: Plastic Surgery

## 2021-10-12 DIAGNOSIS — L719 Rosacea, unspecified: Secondary | ICD-10-CM

## 2021-10-12 NOTE — Progress Notes (Signed)
Sciton ?Preoperative Dx: rosacea ? ?Postoperative Dx:  same ? ?Procedure: laser to face  ? ?Anesthesia: none ? ?Description of Procedure:  ?Risks and complications were explained to the patient. Consent was confirmed and signed. Eye protection was placed. Time out was called and all information was confirmed to be correct. The area  area was prepped with alcohol and wiped dry. The BBL laser was set at 560 nm 14 J/cm2.  The face was lasered. The patient tolerated the procedure well and there were no complications. The patient is to follow up in 4 weeks. ? ? ?

## 2021-11-09 ENCOUNTER — Ambulatory Visit (INDEPENDENT_AMBULATORY_CARE_PROVIDER_SITE_OTHER): Payer: Self-pay | Admitting: Plastic Surgery

## 2021-11-09 ENCOUNTER — Encounter: Payer: Self-pay | Admitting: Plastic Surgery

## 2021-11-09 DIAGNOSIS — L719 Rosacea, unspecified: Secondary | ICD-10-CM

## 2021-11-09 NOTE — Progress Notes (Signed)
Preoperative Dx: Rosacea of the face  Postoperative Dx:  same  Procedure: laser to face  Anesthesia: none  Description of Procedure:  Risks and complications were explained to the patient. Consent was confirmed and signed. Eye protection was placed. Time out was called and all information was confirmed to be correct. The area  area was prepped with alcohol and wiped dry. The BBL laser was set at 560 nm at 7 J/cm2 and 515 nm at 7 J. The face was lasered. The patient tolerated the procedure well and there were no complications. The patient is to follow up in 4 weeks.

## 2021-12-09 ENCOUNTER — Other Ambulatory Visit (HOSPITAL_COMMUNITY)
Admission: RE | Admit: 2021-12-09 | Discharge: 2021-12-09 | Disposition: A | Discharge status: Home / Self Care | Payer: 59 | Source: Ambulatory Visit | Attending: Obstetrics and Gynecology | Admitting: Obstetrics and Gynecology

## 2021-12-09 ENCOUNTER — Other Ambulatory Visit: Payer: Self-pay | Admitting: Obstetrics and Gynecology

## 2021-12-09 DIAGNOSIS — Z01419 Encounter for gynecological examination (general) (routine) without abnormal findings: Secondary | ICD-10-CM | POA: Diagnosis present

## 2021-12-13 LAB — CYTOLOGY - PAP
Comment: NEGATIVE
Diagnosis: NEGATIVE
High risk HPV: NEGATIVE

## 2021-12-16 ENCOUNTER — Encounter: Payer: Self-pay | Admitting: Plastic Surgery

## 2021-12-17 ENCOUNTER — Ambulatory Visit: Payer: 59 | Admitting: Plastic Surgery

## 2021-12-21 ENCOUNTER — Other Ambulatory Visit (HOSPITAL_COMMUNITY)
Admission: RE | Admit: 2021-12-21 | Discharge: 2021-12-21 | Disposition: A | Discharge status: Home / Self Care | Payer: 59 | Source: Ambulatory Visit | Attending: Plastic Surgery | Admitting: Plastic Surgery

## 2021-12-21 ENCOUNTER — Encounter: Payer: Self-pay | Admitting: Plastic Surgery

## 2021-12-21 ENCOUNTER — Ambulatory Visit (INDEPENDENT_AMBULATORY_CARE_PROVIDER_SITE_OTHER): Payer: 59 | Admitting: Plastic Surgery

## 2021-12-21 VITALS — BP 124/85 | HR 100 | Ht 67.0 in

## 2021-12-21 DIAGNOSIS — L988 Other specified disorders of the skin and subcutaneous tissue: Secondary | ICD-10-CM | POA: Diagnosis not present

## 2021-12-21 DIAGNOSIS — L989 Disorder of the skin and subcutaneous tissue, unspecified: Secondary | ICD-10-CM | POA: Diagnosis present

## 2021-12-21 MED ORDER — VALACYCLOVIR HCL 500 MG PO TABS: 500.0000 mg | ORAL_TABLET | Freq: Two times a day (BID) | ORAL | 0 refills | Status: AC

## 2021-12-21 NOTE — Progress Notes (Signed)
Procedure Note  Preoperative Dx:  Changing skin lesion left upper eyelid Changing skin lesion right lower lip  Postoperative Dx: Same  Procedure:  Excision of changing skin lesion of left upper eyelid 4 mm Excision of changing skin lesion of right lower lip 3 mm  Anesthesia: Lidocaine 1% with 1:100,000 epinephrine  Description of Procedure: Risks and complications were explained to the patient.  Consent was confirmed and the patient understands the risks and benefits.  The potential complications and alternatives were explained and the patient consents.  The patient expressed understanding the option of not having the procedure and the risks of a scar.  Time out was called and all information was confirmed to be correct.    The area was prepped and drapped.  Lidocaine 1% with epinepherine was injected in the subcutaneous area.    Eyelid:  After waiting several minutes for the local to take affect a #15 blade was used to excise the area in an eliptical pattern. The skin edges were reapproximated with 6-0 Monocryl.  The specimen was sent to pathology.  Lip:  After waiting several minutes for the local to take affect a #15 blade was used to excise the area in an eliptical pattern. The skin edges were reapproximated with 6-0 Monocryl.  A dressing was applied.  The patient was given instructions on how to care for the area and a follow up appointment.  Cindy Hooper tolerated the procedure well and there were no complications.

## 2021-12-24 LAB — SURGICAL PATHOLOGY

## 2022-01-04 ENCOUNTER — Ambulatory Visit (INDEPENDENT_AMBULATORY_CARE_PROVIDER_SITE_OTHER): Payer: 59 | Admitting: Plastic Surgery

## 2022-01-04 DIAGNOSIS — L989 Disorder of the skin and subcutaneous tissue, unspecified: Secondary | ICD-10-CM

## 2022-01-04 NOTE — Progress Notes (Signed)
The patient is a very sweet 46 year old lady here for follow-up after excision of 2 skin lesions on her face.  She is healing very nicely.  There is no sign of infection.  She has a little irritation that is likely due to the stitch.  I recommend a little bit of Vaseline to the mouth area.  I would not do anything to the eye area for right now.  I removed the stitches.  Let me know if there is any concern or if the irritation does not improve in the next day or 2.  The pathology showed an intradermal nevus nothing of concern.

## 2022-05-15 IMAGING — MG MM DIGITAL SCREENING BILAT W/ TOMO AND CAD
8 of 14 series · 8 of 40 positions shown · non-contrast
Comparison: Previous exam(s).

CLINICAL DATA: Screening.

EXAM:
DIGITAL SCREENING BILATERAL MAMMOGRAM WITH TOMOSYNTHESIS AND CAD
TECHNIQUE: Bilateral screening digital craniocaudal and mediolateral oblique
mammograms were obtained. Bilateral screening digital breast
tomosynthesis was performed. The images were evaluated with
computer-aided detection.

[L XCCL synth-2D]
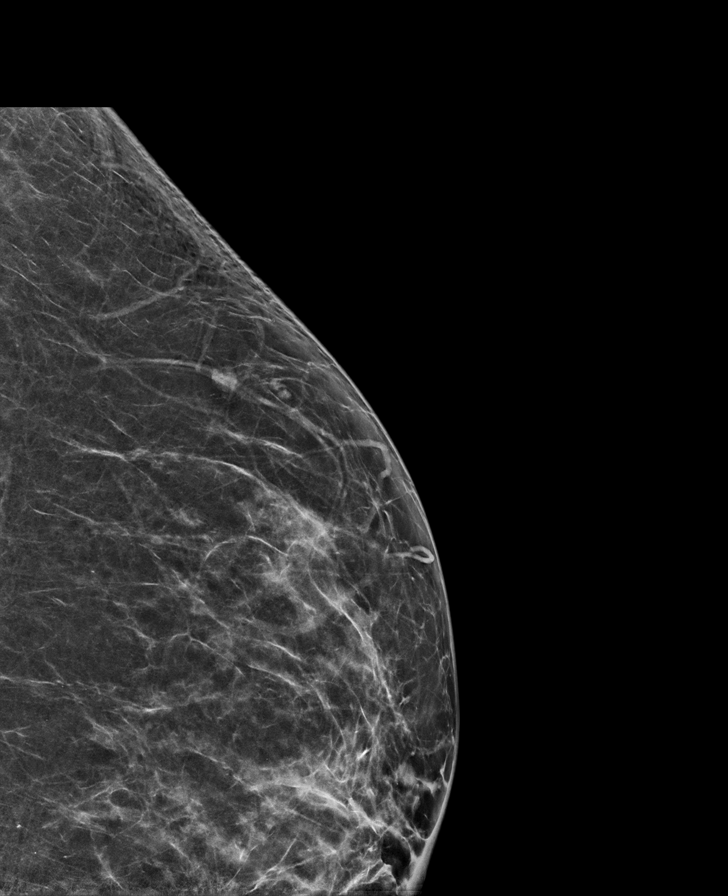

[R CC synth-2D]
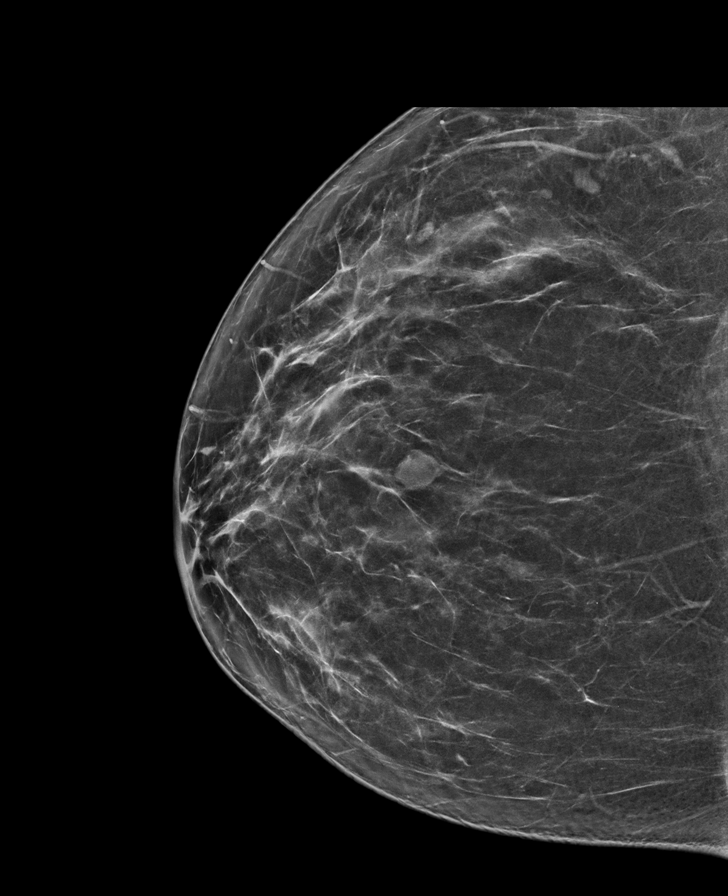

[L MLO synth-2D]
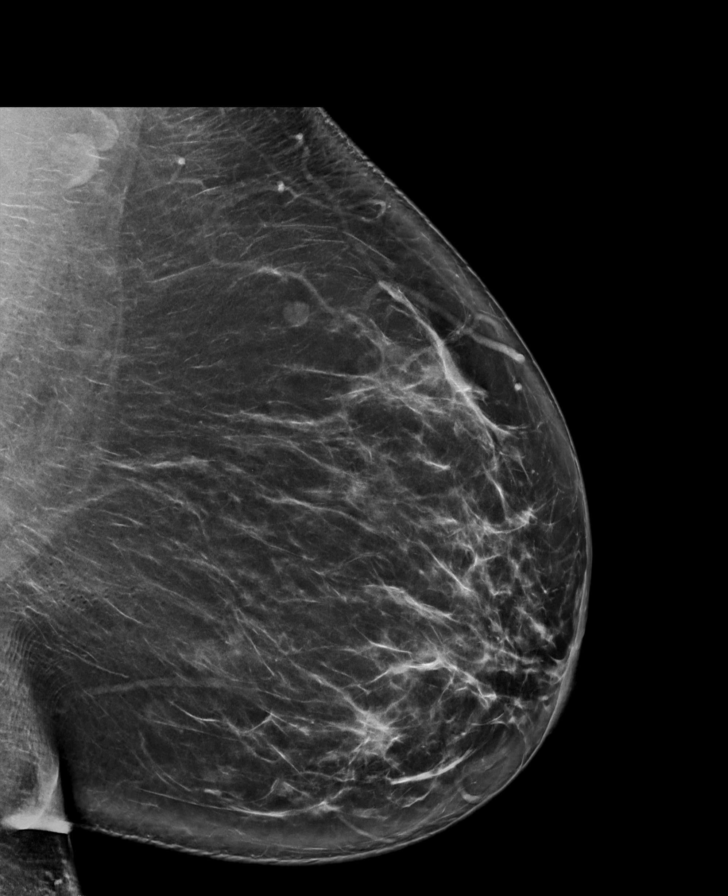

[R MLO synth-2D (1 of 2)]
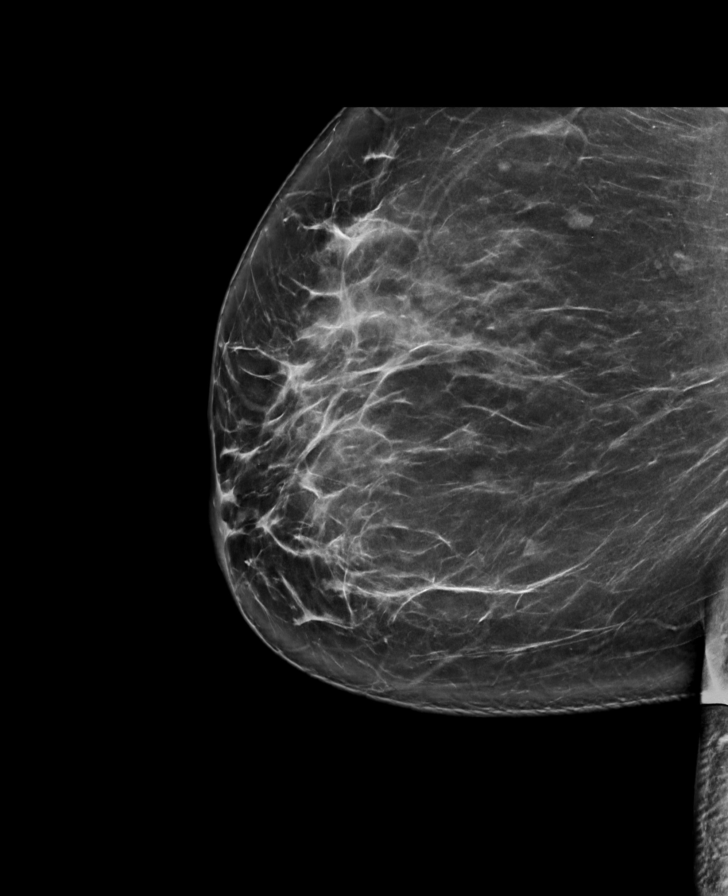

[L CC synth-2D]
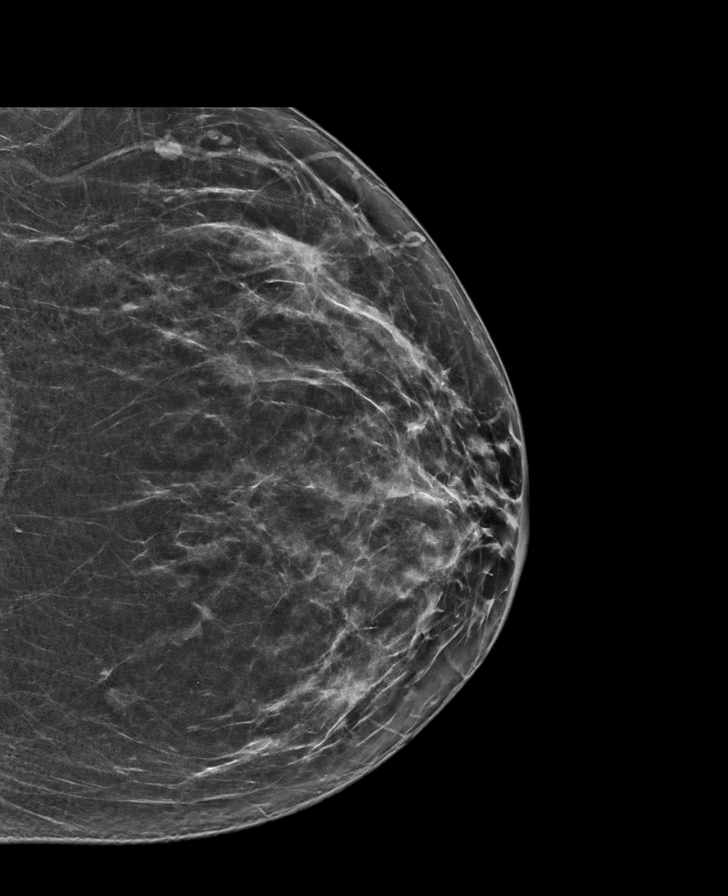

[R MLO synth-2D (2 of 2)]
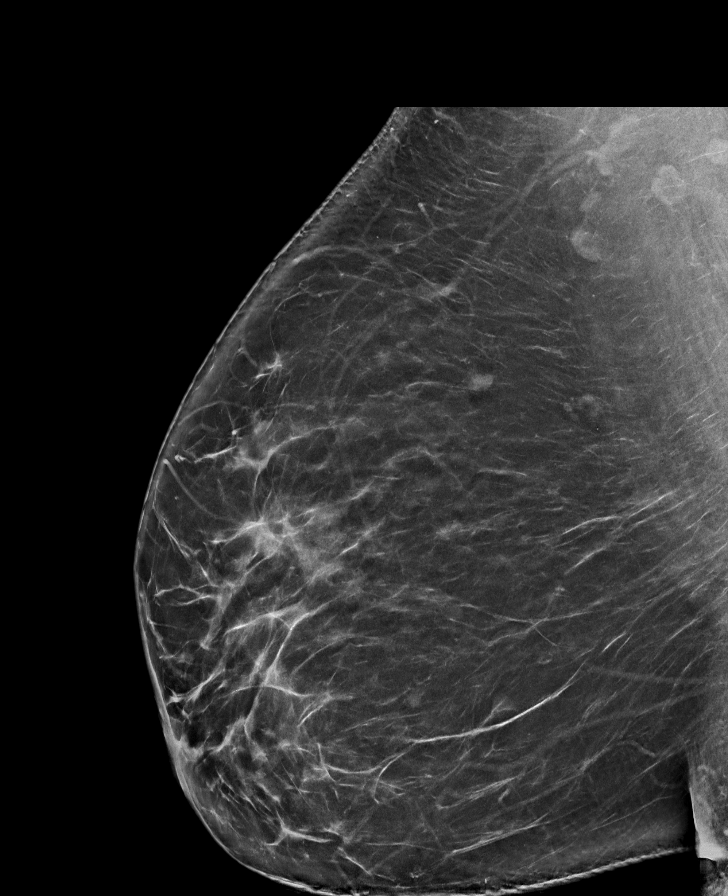

[R XCCL synth-2D]
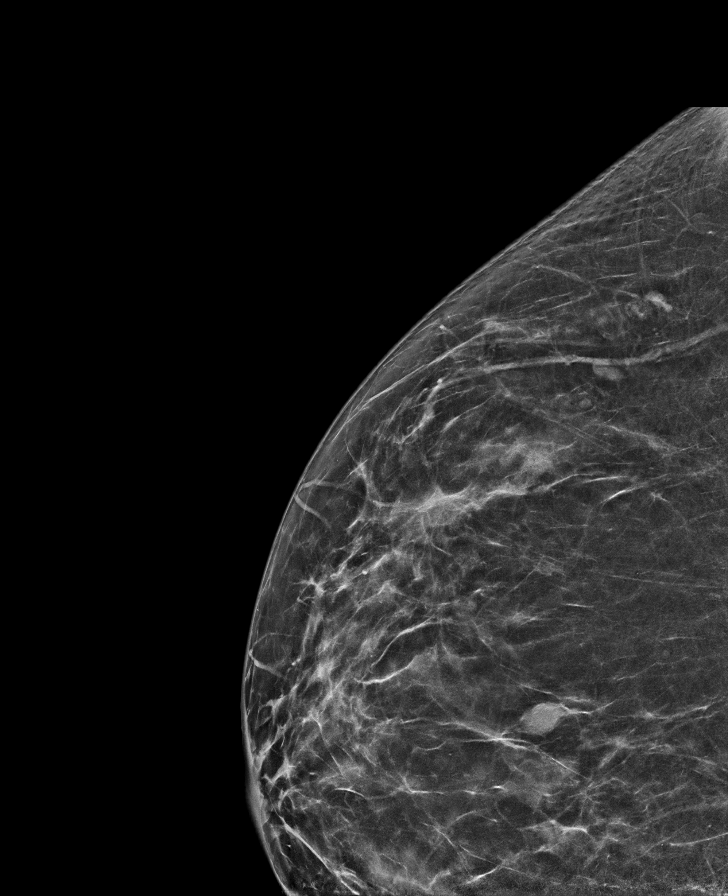

[R XCCL tomo · tomo slice 39/78.0]
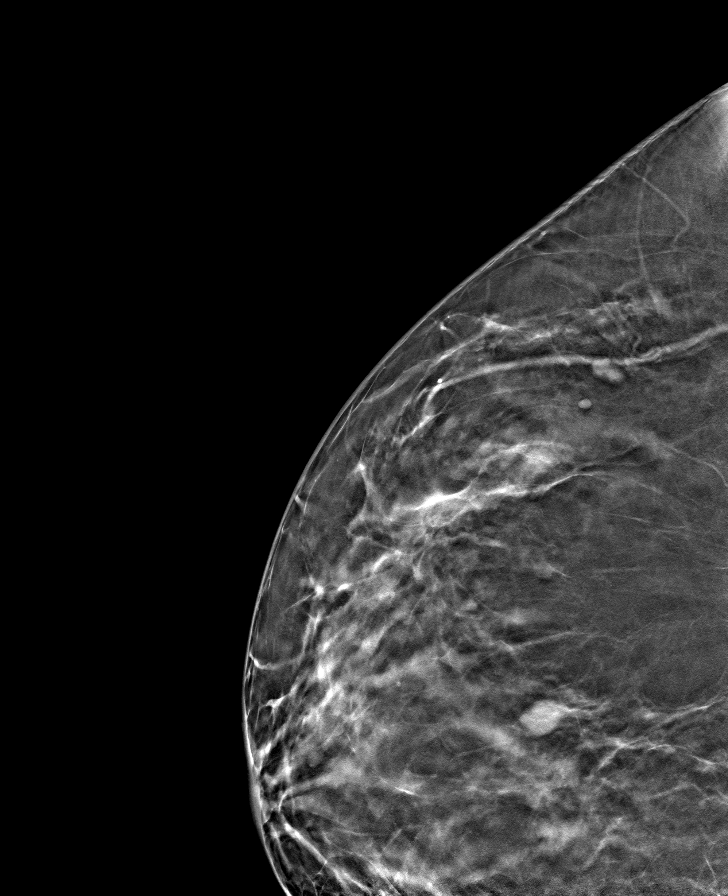

[8 of 40 positions shown; findings below may reference images not displayed]

ACR Breast Density Category b: There are scattered areas of
fibroglandular density.
FINDINGS: There are no findings suspicious for malignancy. The images were
evaluated with computer-aided detection.
IMPRESSION: No mammographic evidence of malignancy. A result letter of this
screening mammogram will be mailed directly to the patient.

RECOMMENDATION:
Screening mammogram in one year. (Code:[id number])

BI-RADS CATEGORY  1: Negative.

## 2022-07-25 NOTE — Progress Notes (Unsigned)
 New Patient Establish Care  Assessment and Plan:  Cindy Hooper was seen today for establish care.  Diagnoses and all orders for this visit: Encounter to establish care Review of medical/family history Follow up in 1 month for CPE with labs   Mixed hyperlipidemia Has been evaluated at cardiologist Dr. Martinique, had coronary artery calcium score that was 0 in 2020/09/09 - Continue diet and exercise - Will check lipids at CPE in 1 month  Essential hypertension - continue medications, DASH diet, exercise and monitor at home. Call if greater than 130/80.   PCOS (polycystic ovarian syndrome) Continue Metformin 500 mg BID Has been on Ozempic but would like to try Mounjaro as her weight loss stalled and is very important to help decrease cardiac risk and diabetes  Vitamin D deficiency Continue Vit D supplementation to maintain value in therapeutic level of 60-100   Type 2 diabetes mellitus without complication, without long-term current use of insulin (HCC) Continue medications: Metformin 500 mg BID Continue diet and exercise.  Will check labs in 1 month at CPE Start Mounjaro 2.92m SQ QW -     tirzepatide (MOUNJARO) 2.5 MG/0.5ML Pen; Inject 2.5 mg into the skin once a week.  Class 2 severe obesity due to excess calories with serious comorbidity in adult, unspecified BMI (HCC) Continue diet and exercise Start Mounjaro 2.5 mg QW     Discussed med's effects and SE's. Screening labs and tests as requested with regular follow-up as recommended. Over 40 minutes of exam, counseling, chart review, and complex, high level critical decision making was performed this visit.   HPI  47y.o. female  presents to establish care as a new patient  Her blood pressure has been controlled at home, Currently on Metoprolol 100 mg qd, today their BP is BP: 124/78  BP Readings from Last 3 Encounters:  07/26/22 124/78  12/21/21 124/85  08/17/21 140/80    She was started on  Metoprolol for sinus tachycardia. She  was having dizzy spells and EKG 130's.  Continue to follow with Dr. JMartinique Strong family history of heart disease and premature CAD with mother dying of sudden death at age 2629and father died of an MI at age 46   In 206-Apr-2022her A1c was 6.8,  previous was 6.7. She is currently on Ozempic 0.5 mg. Has a history of PCOS. She had a little nausea the first 3 weeks, some diarrhea for a month then resolved.    BMI is Body mass index is 37.03 kg/m., she has been working on diet and exercise.Stationary bike 5 days a week for an hour.  Wt Readings from Last 3 Encounters:  07/26/22 236 lb 6.4 oz (107.2 kg)  08/17/21 259 lb 6.4 oz (117.7 kg)  04/16/21 257 lb (116.6 kg)  She does workout. She denies chest pain, shortness of breath, dizziness.   She is not on cholesterol medication and denies myalgias. Her cholesterol is not at goal. Coronary artery calcium score was ) in 20200  The cholesterol last visit was:  04/02/23 Cholesterol 219 Hdl 43 LDL 153 Triglycerides 125    Current Medications:  Current Outpatient Medications on File Prior to Visit  Medication Sig Dispense Refill   Cholecalciferol (VITAMIN D PO) Take 2,000 Units by mouth.     metFORMIN (GLUCOPHAGE) 500 MG tablet Take by mouth 2 (two) times daily with a meal.     metoprolol succinate (TOPROL-XL) 100 MG 24 hr tablet Take 1 tablet by mouth once daily 90 tablet 3  Multiple Vitamin (MULTIVITAMIN) tablet Take 1 tablet by mouth daily.     OZEMPIC, 1 MG/DOSE, 4 MG/3ML SOPN 1 mg Subcutaneous once a week for 28 days     tretinoin (RETIN-A) 0.025 % cream SMARTSIG:Topical Every Evening (Patient not taking: Reported on 07/26/2022)     No current facility-administered medications on file prior to visit.   Allergies:  No Known Allergies Medical History:  She has Sinus tachycardia; Changing skin lesion; and Rosacea on their problem list. Health Maintenance:     Patient Care Team: Donald Prose, MD as PCP - General (Family Medicine)  Surgical  History:  She has a past surgical history that includes None. Family History:  Herfamily history includes Alcohol abuse (age of onset: 93) in her father; CAD (age of onset: 72) in her mother; CAD (age of onset: 16) in her maternal grandmother; CVA (age of onset: 35) in her maternal grandfather; Cancer - Colon in her maternal grandfather and maternal grandmother. Social History:  She reports that she has never smoked. She has never used smokeless tobacco. She reports that she does not drink alcohol and does not use drugs.  Review of Systems: Review of Systems  Constitutional:  Negative for chills, fever and weight loss.  HENT:  Negative for congestion and hearing loss.   Eyes:  Negative for blurred vision and double vision.  Respiratory:  Negative for cough and shortness of breath.   Cardiovascular:  Negative for chest pain, palpitations, orthopnea and leg swelling.  Gastrointestinal:  Negative for abdominal pain, constipation, diarrhea, heartburn, nausea and vomiting.  Musculoskeletal:  Negative for falls, joint pain and myalgias.  Skin:  Negative for rash.  Neurological:  Negative for dizziness, tingling, tremors, loss of consciousness and headaches.  Psychiatric/Behavioral:  Negative for depression, memory loss and suicidal ideas.     Physical Exam: Estimated body mass index is 37.03 kg/m as calculated from the following:   Height as of this encounter: 5' 7"$  (1.702 m).   Weight as of this encounter: 236 lb 6.4 oz (107.2 kg). BP 124/78   Pulse 96   Temp (!) 97.5 F (36.4 C)   Ht 5' 7"$  (1.702 m)   Wt 236 lb 6.4 oz (107.2 kg)   LMP 07/11/2022   SpO2 98%   BMI 37.03 kg/m  General Appearance: Well nourished, in no apparent distress.  Eyes: PERRLA, EOMs, conjunctiva no swelling or erythema, normal fundi and vessels.  Neck: Supple, thyroid normal. No bruits  Respiratory: Respiratory effort normal, BS equal bilaterally without rales, rhonchi, wheezing or stridor.  Cardio: RRR  without murmurs, rubs or gallops. Brisk peripheral pulses without edema.  Chest: symmetric, with normal excursions and percussion.  Musculoskeletal: Full ROM all peripheral extremities,5/5 strength, and normal gait.  Skin: Warm, dry without rashes, lesions, ecchymosis. Neuro: Cranial nerves intact, reflexes equal bilaterally. Normal muscle tone, no cerebellar symptoms. Sensation intact.  Psych: Awake and oriented X 3, normal affect, Insight and Judgment appropriate.     Anelia Carriveau E  4:07 PM  [facility]

## 2022-07-26 ENCOUNTER — Encounter: Payer: Self-pay | Admitting: Nurse Practitioner

## 2022-07-26 ENCOUNTER — Other Ambulatory Visit (HOSPITAL_BASED_OUTPATIENT_CLINIC_OR_DEPARTMENT_OTHER): Payer: Self-pay

## 2022-07-26 ENCOUNTER — Ambulatory Visit (INDEPENDENT_AMBULATORY_CARE_PROVIDER_SITE_OTHER): Payer: 59 | Admitting: Nurse Practitioner

## 2022-07-26 VITALS — BP 124/78 | HR 96 | Temp 97.5°F | Ht 67.0 in | Wt 236.4 lb

## 2022-07-26 DIAGNOSIS — E782 Mixed hyperlipidemia: Secondary | ICD-10-CM | POA: Diagnosis not present

## 2022-07-26 DIAGNOSIS — E66812 Obesity, class 2: Secondary | ICD-10-CM

## 2022-07-26 DIAGNOSIS — E559 Vitamin D deficiency, unspecified: Secondary | ICD-10-CM

## 2022-07-26 DIAGNOSIS — I10 Essential (primary) hypertension: Secondary | ICD-10-CM | POA: Diagnosis not present

## 2022-07-26 DIAGNOSIS — E119 Type 2 diabetes mellitus without complications: Secondary | ICD-10-CM

## 2022-07-26 DIAGNOSIS — E282 Polycystic ovarian syndrome: Secondary | ICD-10-CM | POA: Diagnosis not present

## 2022-07-26 DIAGNOSIS — Z7689 Persons encountering health services in other specified circumstances: Secondary | ICD-10-CM

## 2022-07-26 DIAGNOSIS — R000 Tachycardia, unspecified: Secondary | ICD-10-CM

## 2022-07-26 MED ORDER — MOUNJARO 2.5 MG/0.5ML ~~LOC~~ SOAJ
2.5000 mg | SUBCUTANEOUS | 0 refills | Status: DC
  Filled 2022-07-26: qty 2, 28d supply, fill #0

## 2022-07-27 ENCOUNTER — Encounter (HOSPITAL_BASED_OUTPATIENT_CLINIC_OR_DEPARTMENT_OTHER): Payer: Self-pay

## 2022-07-27 ENCOUNTER — Emergency Department (HOSPITAL_BASED_OUTPATIENT_CLINIC_OR_DEPARTMENT_OTHER)
Admission: EM | Admit: 2022-07-27 | Discharge: 2022-07-27 | Disposition: A | Discharge status: Home / Self Care | Payer: 59 | Attending: Emergency Medicine | Admitting: Emergency Medicine

## 2022-07-27 ENCOUNTER — Emergency Department (HOSPITAL_BASED_OUTPATIENT_CLINIC_OR_DEPARTMENT_OTHER): Payer: 59

## 2022-07-27 ENCOUNTER — Other Ambulatory Visit: Payer: Self-pay

## 2022-07-27 DIAGNOSIS — R112 Nausea with vomiting, unspecified: Secondary | ICD-10-CM | POA: Insufficient documentation

## 2022-07-27 DIAGNOSIS — R000 Tachycardia, unspecified: Secondary | ICD-10-CM | POA: Insufficient documentation

## 2022-07-27 DIAGNOSIS — Z794 Long term (current) use of insulin: Secondary | ICD-10-CM | POA: Diagnosis not present

## 2022-07-27 DIAGNOSIS — Z7984 Long term (current) use of oral hypoglycemic drugs: Secondary | ICD-10-CM | POA: Insufficient documentation

## 2022-07-27 DIAGNOSIS — E119 Type 2 diabetes mellitus without complications: Secondary | ICD-10-CM | POA: Insufficient documentation

## 2022-07-27 LAB — COMPREHENSIVE METABOLIC PANEL
ALT: 33 U/L (ref 0–44)
AST: 21 U/L (ref 15–41)
Albumin: 4.2 g/dL (ref 3.5–5.0)
Alkaline Phosphatase: 78 U/L (ref 38–126)
Anion gap: 9 (ref 5–15)
BUN: 13 mg/dL (ref 6–20)
CO2: 20 mmol/L — ABNORMAL LOW (ref 22–32)
Calcium: 8.7 mg/dL — ABNORMAL LOW (ref 8.9–10.3)
Chloride: 105 mmol/L (ref 98–111)
Creatinine, Ser: 0.92 mg/dL (ref 0.44–1.00)
GFR, Estimated: >60 mL/min (ref >60)
Glucose, Bld: 158 mg/dL — ABNORMAL HIGH (ref 70–99)
Potassium: 3.8 mmol/L (ref 3.5–5.1)
Sodium: 134 mmol/L — ABNORMAL LOW (ref 135–145)
Total Bilirubin: 1 mg/dL (ref 0.3–1.2)
Total Protein: 7.7 g/dL (ref 6.5–8.1)

## 2022-07-27 LAB — CBC WITH DIFFERENTIAL/PLATELET
Abs Immature Granulocytes: 0.05 10*3/uL (ref 0.00–0.07)
Basophils Absolute: 0 10*3/uL (ref 0.0–0.1)
Basophils Relative: 0 %
Eosinophils Absolute: 0.1 10*3/uL (ref 0.0–0.5)
Eosinophils Relative: 1 %
HCT: 45.3 % (ref 36.0–46.0)
Hemoglobin: 14.7 g/dL (ref 12.0–15.0)
Immature Granulocytes: 0 %
Lymphocytes Relative: 4 %
Lymphs Abs: 0.5 10*3/uL — ABNORMAL LOW (ref 0.7–4.0)
MCH: 28.7 pg (ref 26.0–34.0)
MCHC: 32.5 g/dL (ref 30.0–36.0)
MCV: 88.3 fL (ref 80.0–100.0)
Monocytes Absolute: 0.4 10*3/uL (ref 0.1–1.0)
Monocytes Relative: 4 %
Neutro Abs: 10.4 10*3/uL — ABNORMAL HIGH (ref 1.7–7.7)
Neutrophils Relative %: 91 %
Platelets: 323 10*3/uL (ref 150–400)
RBC: 5.13 MIL/uL — ABNORMAL HIGH (ref 3.87–5.11)
RDW: 13.1 % (ref 11.5–15.5)
WBC: 11.5 10*3/uL — ABNORMAL HIGH (ref 4.0–10.5)
nRBC: 0 % (ref 0.0–0.2)

## 2022-07-27 LAB — TSH: TSH: 1.707 u[IU]/mL (ref 0.350–4.500)

## 2022-07-27 LAB — MAGNESIUM: Magnesium: 1.7 mg/dL (ref 1.7–2.4)

## 2022-07-27 LAB — TROPONIN I (HIGH SENSITIVITY)
Troponin I (High Sensitivity): <2 ng/L (ref <18)
Troponin I (High Sensitivity): <2 ng/L (ref <18)

## 2022-07-27 MED ORDER — METOPROLOL SUCCINATE ER 25 MG PO TB24
150.0000 mg | ORAL_TABLET | Freq: Every day | ORAL | Status: DC
  Administered 2022-07-27: 150 mg via ORAL
  Filled 2022-07-27: qty 6

## 2022-07-27 MED ORDER — METOPROLOL TARTRATE 5 MG/5ML IV SOLN
5.0000 mg | Freq: Once | INTRAVENOUS | Status: AC
  Administered 2022-07-27: 5 mg via INTRAVENOUS
  Filled 2022-07-27: qty 5

## 2022-07-27 MED ORDER — LABETALOL HCL 5 MG/ML IV SOLN
5.0000 mg | Freq: Once | INTRAVENOUS | Status: AC
  Administered 2022-07-27: 5 mg via INTRAVENOUS
  Filled 2022-07-27: qty 4

## 2022-07-27 MED ORDER — ONDANSETRON HCL 4 MG/2ML IJ SOLN
4.0000 mg | Freq: Once | INTRAMUSCULAR | Status: AC
  Administered 2022-07-27: 4 mg via INTRAVENOUS
  Filled 2022-07-27: qty 2

## 2022-07-27 MED ORDER — SODIUM CHLORIDE 0.9 % IV BOLUS
1000.0000 mL | Freq: Once | INTRAVENOUS | Status: AC
  Administered 2022-07-27: 1000 mL via INTRAVENOUS

## 2022-07-27 NOTE — ED Notes (Signed)
Noted her HR was elevated by smart watch, has had some dizziness, but not chest pain, ams. Noted on monitor HR up to 156/min at times but at very short intervals. Rhythm is narrow regular complex. Vasovagal maneuvers did not change rate.

## 2022-07-27 NOTE — Discharge Instructions (Signed)
Take metoprolol xl 150 mg once a day if HR over 100.

## 2022-07-27 NOTE — ED Provider Notes (Signed)
[place] [facility] Provider Note   CSN: [id number] Arrival date & time: 07/27/22  1208     History  Chief Complaint  Patient presents with   Tachycardia    Cindy Hooper is a 47 y.o. female.  Pt is a 47 yo female with pmhx significant for sinus tachycardia, dm, pcos, and vit d deficiency.  Pt does see Dr. Martinique for [place] and is on toprol-XL.  Pt woke up this am and had some n/v.  She threw up her metoprolol.  Her watch notified her that her HR was 150.  She feels ok now.  She's never had a HR that high in the past.         Home Medications Prior to Admission medications   Medication Sig Start Date End Date Taking? Authorizing Provider  Cholecalciferol (VITAMIN D PO) Take 2,000 Units by mouth daily.   Yes [provider]  metFORMIN (GLUCOPHAGE) 500 MG tablet Take 500 mg by mouth 2 (two) times daily with a meal.   Yes [provider]  metoprolol succinate (TOPROL-XL) 100 MG 24 hr tablet Take 1 tablet by mouth once daily Patient taking differently: Take 100 mg by mouth daily. 09/27/21  Yes Martinique, Peter M, MD  Multiple Vitamin (MULTIVITAMIN) tablet Take 1 tablet by mouth daily.   Yes [provider]  Semaglutide,0.25 or 0.'5MG'$ /DOS, (OZEMPIC, 0.25 OR 0.5 MG/DOSE,) 2 MG/3ML SOPN Inject 0.5 mg into the skin once a week. Sundays   Yes [provider]  tirzepatide Darcel Bayley) 2.5 MG/0.5ML Pen Inject 2.5 mg into the skin once a week. 07/26/22   Alycia Rossetti, NP      Allergies    Patient has no known allergies.    Review of Systems   Review of Systems  Cardiovascular:  Positive for palpitations.  All other systems reviewed and are negative.   Physical Exam Updated Vital Signs BP 122/68   Pulse (!) 144   Temp 98.7 F (37.1 C) (Oral)   Resp (!) 26   Ht '5\' 7"'$  (1.702 m)   Wt 107.2 kg   LMP 07/10/2022 (Approximate)   SpO2 97%   BMI 37.03 kg/m  Physical Exam Vitals and nursing note reviewed.   Constitutional:      Appearance: Normal appearance.  HENT:     Head: Normocephalic and atraumatic.     Right Ear: External ear normal.     Left Ear: External ear normal.     Nose: Nose normal.     Mouth/Throat:     Mouth: Mucous membranes are moist.     Pharynx: Oropharynx is clear.  Eyes:     Extraocular Movements: Extraocular movements intact.     Conjunctiva/sclera: Conjunctivae normal.     Pupils: Pupils are equal, round, and reactive to light.  Cardiovascular:     Rate and Rhythm: Regular rhythm. Tachycardia present.     Pulses: Normal pulses.     Heart sounds: Normal heart sounds.  Pulmonary:     Effort: Pulmonary effort is normal.     Breath sounds: Normal breath sounds.  Abdominal:     General: Abdomen is flat. Bowel sounds are normal.     Palpations: Abdomen is soft.  Musculoskeletal:        General: Normal range of motion.     Cervical back: Normal range of motion and neck supple.  Skin:    General: Skin is warm.     Capillary Refill: Capillary refill takes  less than 2 seconds.  Neurological:     General: No focal deficit present.     Mental Status: She is alert and oriented to person, place, and time.  Psychiatric:        Mood and Affect: Mood normal.        Behavior: Behavior normal.     ED Results / Procedures / Treatments   Labs (all labs ordered are listed, but only abnormal results are displayed) Labs Reviewed  COMPREHENSIVE METABOLIC PANEL - Abnormal; Notable for the following components:      Result Value   Sodium 134 (*)    CO2 20 (*)    Glucose, Bld 158 (*)    Calcium 8.7 (*)    All other components within normal limits  CBC WITH DIFFERENTIAL/PLATELET - Abnormal; Notable for the following components:   WBC 11.5 (*)    RBC 5.13 (*)    Neutro Abs 10.4 (*)    Lymphs Abs 0.5 (*)    All other components within normal limits  TSH  MAGNESIUM  TROPONIN I (HIGH SENSITIVITY)  TROPONIN I (HIGH SENSITIVITY)    EKG EKG  Interpretation  Date/Time:  Wednesday July 27 2022 12:35:28 EST Ventricular Rate:  124 PR Interval:  142 QRS Duration: 90 QT Interval:  320 QTC Calculation: 460 R Axis:   54 Text Interpretation: Sinus tachycardia Low voltage, precordial leads Borderline T abnormalities, anterior leads Confirmed by Gerlene Fee [phone]) on 07/29/2022 7:40:50 AM  Radiology No results found.  Procedures Procedures    Medications Ordered in ED Medications  sodium chloride 0.9 % bolus 1,000 mL ( Intravenous Stopped 07/27/22 1327)  ondansetron (ZOFRAN) injection 4 mg (4 mg Intravenous Given 07/27/22 1226)  metoprolol tartrate (LOPRESSOR) injection 5 mg (5 mg Intravenous Given 07/27/22 1226)  metoprolol tartrate (LOPRESSOR) injection 5 mg (5 mg Intravenous Given 07/27/22 1332)  labetalol (NORMODYNE) injection 5 mg (5 mg Intravenous Given 07/27/22 1515)    ED Course/ Medical Decision Making/ A&P Clinical Course as of 07/30/22 1502  Wed Jul 27, 2022  1533 Stable46 YOF with a history of Sinus Tach Got 5 metop and came down to 120 Plan for cards consult [CC]    Clinical Course User Index [CC] Tretha Sciara, MD                             Medical Decision Making Amount and/or Complexity of Data Reviewed Labs: ordered. Radiology: ordered.  Risk Prescription drug management.   This patient presents to the ED for concern of palpitations, this involves an extensive number of treatment options, and is a complaint that carries with it a high risk of complications and morbidity.  The differential diagnosis includes svt, afib, st   Co morbidities that complicate the patient evaluation  sinus tachycardia, dm, pcos, and vit d deficiency   Additional history obtained:  Additional history obtained from epic chart review External records from outside source obtained and reviewed including husband   Lab Tests:  I Ordered, and personally interpreted labs.  The pertinent results include:  cbc  nl, cmp nl, mg nl   Imaging Studies ordered:  I ordered imaging studies including cxr  I independently visualized and interpreted imaging which showed Normal chest radiograph.  I agree with the radiologist interpretation   Cardiac Monitoring:  The patient was maintained on a cardiac monitor.  I personally viewed and interpreted the cardiac monitored which showed an underlying rhythm of: sinus tachycardia  Medicines ordered and prescription drug management:  I ordered medication including lopressor  for tachycardia  Reevaluation of the patient after these medicines showed that the patient improved I have reviewed the patients home medicines and have made adjustments as needed   Consultations Obtained:  I requested consultation with the cardiologist (Dr. Martinique),  and discussed lab and imaging findings as well as pertinent plan - he recommended increasing toprol xl to 150 mg daily.   Problem List / ED Course:  Sinus tachycardia:  HR is elevated, but improved with iv lopressor.  It remains elevated.  No other cause of tachycardia.  Pt said she feels fine.  Pt d/w cards who recommends increasing toprol.  Pt is to return if worse.  F/u with cards.   Reevaluation:  After the interventions noted above, I reevaluated the patient and found that they have :improved   Social Determinants of Health:  Lives at home   Dispostion:  After consideration of the diagnostic results and the patients response to treatment, I feel that the patent would benefit from discharge with outpatient f/u.          Final Clinical Impression(s) / ED Diagnoses Final diagnoses:  Sinus tachycardia    Rx / DC Orders ED Discharge Orders     None         Isla Pence, MD 07/30/22 1502

## 2022-07-27 NOTE — ED Triage Notes (Signed)
States woke up this morning and had some N/V/D. Said her watch notified her of high HR and was reading 150. Denies CP/SOB, states feels normal, denies hx of SVT. HR 160-170 during triage. Dr. Gilford Raid at bedside

## 2022-07-27 NOTE — ED Notes (Signed)
12 LEAD ECG REPEATED POST BETABLOCKER GIVEN, HR 125/MIN, NARROW COMPLEX, REG, NO ECTOPY

## 2022-07-27 NOTE — ED Notes (Signed)
Discharge paperwork reviewed entirely with patient, including Rx's and follow up care. Pain was under control. Pt verbalized understanding as well as all parties involved. No questions or concerns voiced at the time of discharge. No acute distress noted.   Pt ambulated out to PVA without incident or assistance.  

## 2022-08-04 NOTE — Progress Notes (Signed)
Cardiology Clinic Note   Patient Name: Cindy Hooper Date of Encounter: 08/05/2022  Primary Care Provider:  Donald Prose, MD Primary Cardiologist:  None  Patient Profile    47 year old with history of tachycardia, HTN, HL, PCOS, and Vitamin D deficiency. Last seen by Dr. Martinique on 08/17/2021. Continued on Toprol.  Past Medical History    Past Medical History:  Diagnosis Date   Benign essential HTN 05/2016   New diagnosis as of 05/2016   PCOS (polycystic ovarian syndrome) 2011   Vitamin D deficiency    Past Surgical History:  Procedure Laterality Date   None      Allergies  No Known Allergies  History of Present Illness    Mrs. Cindy Hooper returns today for ongoing assessment and management of tachycardia, HL, with hx of PCOS and Vit D deficiency.  She was seen in the ED on 07/27/2022 after vomiting up metoprolol and had become tachycardic. Treated with IV lopressor and recovered well. Toprol XL was increased to 150 mg daily.  Patient instructions were that she should increase it to 150 mg daily if heart rate was greater than 100.  This was not indicated in ED notes.  Ms. states she is doing very well.  She has recovered from her GI virus and heart rate has been well-controlled on metoprolol XL 100 mg daily.  She is back to her normal activities and has no further complaints.  Home Medications    Current Outpatient Medications  Medication Sig Dispense Refill   Cholecalciferol (VITAMIN D PO) Take 2,000 Units by mouth daily.     metFORMIN (GLUCOPHAGE) 500 MG tablet Take 500 mg by mouth 2 (two) times daily with a meal.     Multiple Vitamin (MULTIVITAMIN) tablet Take 1 tablet by mouth daily.     tirzepatide [facility]) 2.5 MG/0.5ML Pen Inject 2.5 mg into the skin once a week. 2 mL 0   metoprolol succinate (TOPROL-XL) 100 MG 24 hr tablet Take 1 tablet (100 mg total) by mouth daily. Take with or immediately following a meal. 90 tablet 3   Semaglutide,0.25 or 0.'5MG'$ /DOS, (OZEMPIC, 0.25  OR 0.5 MG/DOSE,) 2 MG/3ML SOPN Inject 0.5 mg into the skin once a week. Sundays (Patient not taking: Reported on 08/05/2022)     No current facility-administered medications for this visit.     Family History    Family History  Problem Relation Age of Onset   CAD Mother 35       cardiac arrest while on vacation   Alcohol abuse Father 21   CAD Maternal Grandmother 85       pt got a PPM at 52, died at 37   Cancer - Colon Maternal Grandmother    CVA Maternal Grandfather 58   Cancer - Colon Maternal Grandfather    Breast cancer Neg Hx    She indicated that her mother is deceased. She indicated that her father is deceased. She indicated that her maternal grandmother is deceased. She indicated that her maternal grandfather is deceased. She indicated that her paternal grandmother is deceased. She indicated that her paternal grandfather is deceased. She indicated that the status of her neg hx is unknown.  Social History    Social History   Socioeconomic History   Marital status: Married    Spouse name: Not on file   Number of children: Not on file   Years of education: Not on file   Highest education level: Not on file  Occupational History  Occupation: Therapist, sports for [employer]  Tobacco Use   Smoking status: Never   Smokeless tobacco: Never  Vaping Use   Vaping Use: Never used  Substance and Sexual Activity   Alcohol use: No   Drug use: No   Sexual activity: Not on file  Other Topics Concern   Not on file  Social History Narrative   Pt lives with fiance in [place].   Social Determinants of Health   Financial Resource Strain: Not on file  Food Insecurity: Not on file  Transportation Needs: Not on file  Physical Activity: Not on file  Stress: Not on file  Social Connections: Not on file  Intimate Partner Violence: Not on file     Review of Systems    General:  No chills, fever, night sweats or weight changes.  Cardiovascular:  No chest pain, dyspnea on exertion, edema,  orthopnea, palpitations, paroxysmal nocturnal dyspnea. Dermatological: No rash, lesions/masses Respiratory: No cough, dyspnea Urologic: No hematuria, dysuria Abdominal:   No nausea, vomiting, diarrhea, bright red blood per rectum, melena, or hematemesis Neurologic:  No visual changes, wkns, changes in mental status. All other systems reviewed and are otherwise negative except as noted above.     Physical Exam    VS:  BP 130/88   Pulse 86   Ht '5\' 7"'$  (1.702 m)   Wt 232 lb (105.2 kg)   LMP 07/10/2022 (Approximate)   SpO2 99%   BMI 36.34 kg/m  , BMI Body mass index is 36.34 kg/m.     GEN: Well nourished, well developed, in no acute distress. HEENT: normal. Neck: Supple, no JVD, carotid bruits, or masses. Cardiac: RRR, no murmurs, rubs, or gallops. No clubbing, cyanosis, edema.  Radials/DP/PT 2+ and equal bilaterally.  Respiratory:  Respirations regular and unlabored, clear to auscultation bilaterally. GI: Soft, nontender, nondistended, BS + x 4. MS: no deformity or atrophy. Skin: warm and dry, no rash. Neuro:  Strength and sensation are intact. Psych: Normal affect.  Accessory Clinical Findings    ECG personally reviewed by me today-normal sinus rhythm, ventricular rate of 86 bpm- No acute changes  Lab Results  Component Value Date   WBC 11.5 (H) 07/27/2022   HGB 14.7 07/27/2022   HCT 45.3 07/27/2022   MCV 88.3 07/27/2022   PLT 323 07/27/2022   Lab Results  Component Value Date   CREATININE 0.92 07/27/2022   BUN 13 07/27/2022   NA 134 (L) 07/27/2022   K 3.8 07/27/2022   CL 105 07/27/2022   CO2 20 (L) 07/27/2022   Lab Results  Component Value Date   ALT 33 07/27/2022   AST 21 07/27/2022   ALKPHOS 78 07/27/2022   BILITOT 1.0 07/27/2022   No results found for: "CHOL", "HDL", "LDLCALC", "LDLDIRECT", "TRIG", "CHOLHDL"  No results found for: "HGBA1C"  Review of Prior Studies: Echocardiogram 06/15/2016 Left ventricle: The cavity size was normal. Wall thickness  was    normal. Systolic function was normal. The estimated ejection    fraction was in the range of 60% to 65%. Wall motion was normal;    there were no regional wall motion abnormalities. Features are    consistent with a pseudonormal left ventricular filling pattern,    with concomitant abnormal relaxation and increased filling    pressure (grade 2 diastolic dysfunction).  - Mitral valve: Mildly to moderately calcified annulus.  - Left atrium: The atrium was mildly dilated.   Coronary Calcium Score 06/18/2020 Coronary calcium score of 0. This was N/A percentile  for age and sex matched control.  Assessment & Plan   1.  Sinus tachycardia: Brief episode of elevated heart rate in the setting of GI virus after traveling to [facility].  She was seen in the ED and given IV Lopressor, labetalol, and p.o. metoprolol.  Heart rate did normalize.  It was recommended that she take Toprol XL 150 mg, with patient instructions for increased dos as needed elevated heart rate greater than 100 bpm.  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  She will remain on Toprol XL 100 mg daily with refills provided.  She will follow-up with Dr. Martinique in 1 year as directed.    Current medicines are reviewed at length with the patient today.  I have spent 25 min's  dedicated to the care of this patient on the date of this encounter to include pre-visit review of records, assessment, management and diagnostic testing,with shared decision making. Signed, Phill Myron. West Pugh, ANP, [place]   08/05/2022 3:31 PM      Office [phone] Fax [phone]  Notice: This dictation was prepared with Dragon dictation along with smaller phrase technology. Any transcriptional errors that result from this process are unintentional and may not be corrected upon review.

## 2022-08-05 ENCOUNTER — Encounter: Payer: Self-pay | Admitting: Adult Health

## 2022-08-05 ENCOUNTER — Ambulatory Visit: Payer: 59 | Attending: Adult Health | Admitting: Adult Health

## 2022-08-05 VITALS — BP 130/88 | HR 86 | Ht 67.0 in | Wt 232.0 lb

## 2022-08-05 DIAGNOSIS — R000 Tachycardia, unspecified: Secondary | ICD-10-CM

## 2022-08-05 MED ORDER — METOPROLOL SUCCINATE ER 100 MG PO TB24: 100.0000 mg | ORAL_TABLET | Freq: Every day | ORAL | 3 refills | Status: DC

## 2022-08-05 NOTE — Patient Instructions (Signed)
Medication Instructions:  No Changges *If you need a refill on your cardiac medications before your next appointment, please call your pharmacy*   Lab Work: No Labs If you have labs (blood work) drawn today and your tests are completely normal, you will receive your results only by: [place] (if you have MyChart) OR A paper copy in the mail If you have any lab test that is abnormal or we need to change your treatment, we will call you to review the results.   Testing/Procedures: No Testing   Follow-Up: At [facility], you and your health needs are our priority.  As part of our continuing mission to provide you with exceptional heart care, we have created designated Provider Care Teams.  These Care Teams include your primary Cardiologist (physician) and Advanced Practice Providers (APPs -  Physician Assistants and Nurse Practitioners) who all work together to provide you with the care you need, when you need it.  We recommend signing up for the patient portal called "MyChart".  Sign up information is provided on this After Visit Summary.  MyChart is used to connect with patients for Virtual Visits (Telemedicine).  Patients are able to view lab/test results, encounter notes, upcoming appointments, etc.  Non-urgent messages can be sent to your provider as well.   To learn more about what you can do with MyChart, go to [email or web address].    Your next appointment:   1 year(s)  Provider:   Peter Martinique, MD

## 2022-08-29 ENCOUNTER — Other Ambulatory Visit: Payer: Self-pay | Admitting: Nurse Practitioner

## 2022-08-29 ENCOUNTER — Other Ambulatory Visit (HOSPITAL_BASED_OUTPATIENT_CLINIC_OR_DEPARTMENT_OTHER): Payer: Self-pay

## 2022-08-29 DIAGNOSIS — E119 Type 2 diabetes mellitus without complications: Secondary | ICD-10-CM

## 2022-08-29 MED ORDER — MOUNJARO 2.5 MG/0.5ML ~~LOC~~ SOAJ
2.5000 mg | SUBCUTANEOUS | 0 refills | Status: DC
  Filled 2022-08-29: qty 2, 28d supply, fill #0

## 2022-08-31 ENCOUNTER — Encounter: Payer: 59 | Admitting: Nurse Practitioner

## 2022-09-01 NOTE — Progress Notes (Signed)
Complete Physical  Assessment and Plan:  Discussed med's effects and SE's. Screening labs and tests as requested with regular follow-up as recommended.  Jaleiya was seen today for annual exam.  Diagnoses and all orders for this visit:  Encounter for general adult medical examination with abnormal findings Due Yearly Mammogram scheduled for 09/08/22 EKG deferred seen last month at cardiology and had EKG  Hyperlipidemia associated with type 2 diabetes mellitus Continue diet and exercise -     COMPLETE METABOLIC PANEL WITH GFR -     Lipid panel  Sinus tachycardia Controlled on Metoprolol 100 mg daily Continue to follow with cardiology  Essential hypertension - continue medications, DASH diet, exercise and monitor at home. Call if greater than 130/80.  -     CBC with Differential/Platelet  PCOS (polycystic ovarian syndrome) Continue Mounjaro  Continue to follow with GYN  Vitamin D deficiency Continue Vit D supplementation to maintain value in therapeutic level of 60-100  -     VITAMIN D 25 Hydroxy (Vit-D Deficiency, Fractures)  Type 2 diabetes mellitus without complication, without long-term current use of insulin Perform daily foot/skin check, notify office of any concerning changes.  Continue to work on diet and exercise Continue Mounjaro but dose is increased to 5 mg once a week SQ -     COMPLETE METABOLIC PANEL WITH GFR -     Hemoglobin A1c  Type 2 diabetes mellitus with obesity Continue diet, exercise and medications -     tirzepatide (MOUNJARO) 5 MG/0.5ML Pen; Inject 5 mg into the skin once a week.  Medication management -     CBC with Differential/Platelet -     COMPLETE METABOLIC PANEL WITH GFR -     Magnesium -     Lipid panel -     TSH -     Hemoglobin A1c -     VITAMIN D 25 Hydroxy (Vit-D Deficiency, Fractures) -     Urinalysis, Routine w reflex microscopic -     Microalbumin / creatinine urine ratio  Screening for hematuria or proteinuria -      Urinalysis, Routine w reflex microscopic -     Microalbumin / creatinine urine ratio  Screening for thyroid disorder -     TSH  Screening for colon cancer -     Ambulatory referral to Gastroenterology      Over 40 minutes of exam, counseling, chart review, and complex, high level critical decision making was performed this visit.   HPI  47 y.o. female  presents for a complete physical and follow up for has Sinus tachycardia; Changing skin lesion; and Rosacea on their problem list..  She did have an ER visit for tachycardia 07/27/22 - she had a stomach virus and was unable to keep her metoprolol down.  Since that time she has recovered from virus and heart rate has been controlled.  Follows with cardiology, last visit 08/05/22- was to continue on Toprol XL 100 mg daily and follow up with Dr. Martinique in 1 year  Her blood pressure has been controlled at home on Metoprolol 100 mg QD, today their BP is BP: 118/78  BP Readings from Last 3 Encounters:  09/05/22 118/78  08/05/22 130/88  07/27/22 122/68  She does workout. She denies chest pain, shortness of breath, dizziness.   BMI is Body mass index is 36.34 kg/m., she has been working on diet and exercise.She is doing stationary bike 4-5 days a week for 25-30 minutes. Wt Readings from Last 3 Encounters:  09/05/22 232 lb (105.2 kg)  08/05/22 232 lb (105.2 kg)  07/27/22 236 lb 6.4 oz (107.2 kg)     She is not on cholesterol medication and denies myalgias. She has a history of elevated cholesterol. Will get a lipid panel today  She has been working on diet and exercise for Type 2 Diabetes, she is not on bASA, she is not on ACE/ARB and denies hyperglycemia, hypoglycemia , and paresthesia of the feet. Will get A1c today.   Last GFR: Lab Results  Component Value Date   GFRNONAA >60 07/27/2022     Patient is not on Vitamin D supplement but has a history of Vitamin D deficiency  Current Medications:  Current Outpatient Medications on  File Prior to Visit  Medication Sig Dispense Refill   Cholecalciferol (VITAMIN D PO) Take 2,000 Units by mouth daily.     metFORMIN (GLUCOPHAGE) 500 MG tablet Take 500 mg by mouth 2 (two) times daily with a meal.     metoprolol succinate (TOPROL-XL) 100 MG 24 hr tablet Take 1 tablet (100 mg total) by mouth daily. Take with or immediately following a meal. 90 tablet 3   Multiple Vitamin (MULTIVITAMIN) tablet Take 1 tablet by mouth daily.     tirzepatide [facility]) 2.5 MG/0.5ML Pen Inject 2.5 mg into the skin once a week. 2 mL 0   No current facility-administered medications on file prior to visit.   Allergies:  No Known Allergies Medical History:  She has Sinus tachycardia; Changing skin lesion; and Rosacea on their problem list. Health Maintenance:   Immunization History  Administered Date(s) Administered   PFIZER(Purple Top)SARS-COV-2 Vaccination 07/09/2019, 08/05/2019, 03/31/2020, 04/23/2021   Health Maintenance  Topic Date Due   HIV Screening  Never done   Diabetic kidney evaluation - Urine ACR  Never done   Hepatitis C Screening  Never done   DTaP/Tdap/Td (1 - Tdap) Never done   COLONOSCOPY (Pts 45-53yrs Insurance coverage will need to be confirmed)  Never done   COVID-19 Vaccine (5 - 2023-24 season) 02/04/2022   INFLUENZA VACCINE  01/05/2023   Diabetic kidney evaluation - eGFR measurement  07/28/2023   PAP SMEAR-Modifier  12/09/2024   HPV VACCINES  Aged Out   Mammogram is scheduled for Thursday 09/08/22  Last Dental Exam: Last Eye Exam: Patient Care Team: Alycia Rossetti, NP as PCP - General (Nurse Practitioner)  Surgical History:  She has a past surgical history that includes None. Family History:  Herfamily history includes Alcohol abuse (age of onset: 26) in her father; CAD (age of onset: 2) in her mother; CAD (age of onset: 84) in her maternal grandmother; CVA (age of onset: 50) in her maternal grandfather; Cancer - Colon in her maternal grandfather and maternal  grandmother. Social History:  She reports that she has never smoked. She has never used smokeless tobacco. She reports that she does not drink alcohol and does not use drugs.  Review of Systems: Review of Systems  Constitutional:  Negative for chills and fever.  HENT:  Negative for congestion, hearing loss, sinus pain, sore throat and tinnitus.   Eyes:  Negative for blurred vision and double vision.  Respiratory:  Negative for cough, hemoptysis, sputum production, shortness of breath and wheezing.   Cardiovascular:  Negative for chest pain, palpitations and leg swelling.  Gastrointestinal:  Negative for abdominal pain, constipation, diarrhea, heartburn, nausea and vomiting.  Genitourinary:  Negative for dysuria and urgency.  Musculoskeletal:  Negative for back pain, falls, joint pain, myalgias and  neck pain.  Skin:  Negative for rash.  Neurological:  Negative for dizziness, tingling, tremors, weakness and headaches.  Endo/Heme/Allergies:  Does not bruise/bleed easily.  Psychiatric/Behavioral:  Negative for depression and suicidal ideas. The patient is not nervous/anxious and does not have insomnia.     Physical Exam: Estimated body mass index is 36.34 kg/m as calculated from the following:   Height as of this encounter: 5\' 7"  (1.702 m).   Weight as of this encounter: 232 lb (105.2 kg). BP 118/78   Pulse 86   Temp (!) 97.5 F (36.4 C)   Ht 5\' 7"  (1.702 m)   Wt 232 lb (105.2 kg)   LMP 08/28/2022   SpO2 96%   BMI 36.34 kg/m  General Appearance: Well nourished, in no apparent distress.  Eyes: PERRLA, EOMs, conjunctiva no swelling or erythema, normal fundi and vessels.  Sinuses: No Frontal/maxillary tenderness  ENT/Mouth: Ext aud canals clear, normal light reflex with TMs without erythema, bulging. Good dentition. No erythema, swelling, or exudate on post pharynx. Tonsils not swollen or erythematous. Hearing normal.  Neck: Supple, thyroid normal. No bruits  Respiratory: Respiratory  effort normal, BS equal bilaterally without rales, rhonchi, wheezing or stridor.  Cardio: RRR without murmurs, rubs or gallops. Brisk peripheral pulses without edema.  Chest: symmetric, with normal excursions and percussion.  Breasts: Symmetric, without lumps, nipple discharge, retractions.  Abdomen: Soft, nontender, no guarding, rebound, hernias, masses, or organomegaly.  Lymphatics: Non tender without lymphadenopathy.  Genitourinary:  Musculoskeletal: Full ROM all peripheral extremities,5/5 strength, and normal gait.  Skin: Warm, dry without rashes, lesions, ecchymosis. Neuro: Cranial nerves intact, reflexes equal bilaterally. Normal muscle tone, no cerebellar symptoms. Sensation intact.  Psych: Awake and oriented X 3, normal affect, Insight and Judgment appropriate.   EKG: deferred had at cardiology last month   Mykle Pascua E Carlye Grippe 2:05 PM [facility]

## 2022-09-05 ENCOUNTER — Other Ambulatory Visit (HOSPITAL_BASED_OUTPATIENT_CLINIC_OR_DEPARTMENT_OTHER): Payer: Self-pay

## 2022-09-05 ENCOUNTER — Encounter: Payer: Self-pay | Admitting: Nurse Practitioner

## 2022-09-05 ENCOUNTER — Ambulatory Visit (INDEPENDENT_AMBULATORY_CARE_PROVIDER_SITE_OTHER): Payer: 59 | Admitting: Nurse Practitioner

## 2022-09-05 VITALS — BP 118/78 | HR 86 | Temp 97.5°F | Ht 67.0 in | Wt 232.0 lb

## 2022-09-05 DIAGNOSIS — Z0001 Encounter for general adult medical examination with abnormal findings: Secondary | ICD-10-CM

## 2022-09-05 DIAGNOSIS — Z0000 Encounter for general adult medical examination without abnormal findings: Secondary | ICD-10-CM | POA: Diagnosis not present

## 2022-09-05 DIAGNOSIS — Z1211 Encounter for screening for malignant neoplasm of colon: Secondary | ICD-10-CM

## 2022-09-05 DIAGNOSIS — Z79899 Other long term (current) drug therapy: Secondary | ICD-10-CM

## 2022-09-05 DIAGNOSIS — E782 Mixed hyperlipidemia: Secondary | ICD-10-CM

## 2022-09-05 DIAGNOSIS — E119 Type 2 diabetes mellitus without complications: Secondary | ICD-10-CM

## 2022-09-05 DIAGNOSIS — Z136 Encounter for screening for cardiovascular disorders: Secondary | ICD-10-CM

## 2022-09-05 DIAGNOSIS — I10 Essential (primary) hypertension: Secondary | ICD-10-CM

## 2022-09-05 DIAGNOSIS — E669 Obesity, unspecified: Secondary | ICD-10-CM

## 2022-09-05 DIAGNOSIS — E282 Polycystic ovarian syndrome: Secondary | ICD-10-CM

## 2022-09-05 DIAGNOSIS — Z1329 Encounter for screening for other suspected endocrine disorder: Secondary | ICD-10-CM

## 2022-09-05 DIAGNOSIS — E559 Vitamin D deficiency, unspecified: Secondary | ICD-10-CM

## 2022-09-05 DIAGNOSIS — R000 Tachycardia, unspecified: Secondary | ICD-10-CM

## 2022-09-05 DIAGNOSIS — E1169 Type 2 diabetes mellitus with other specified complication: Secondary | ICD-10-CM

## 2022-09-05 DIAGNOSIS — Z1389 Encounter for screening for other disorder: Secondary | ICD-10-CM

## 2022-09-05 MED ORDER — MOUNJARO 5 MG/0.5ML ~~LOC~~ SOAJ
5.0000 mg | SUBCUTANEOUS | 3 refills | Status: DC
  Filled 2022-09-05 – 2022-09-26 (×4): qty 2, 28d supply, fill #0

## 2022-09-05 NOTE — Patient Instructions (Signed)

## 2022-09-06 LAB — CBC WITH DIFFERENTIAL/PLATELET
Absolute Monocytes: 484 cells/uL (ref 200–950)
Basophils Absolute: 47 cells/uL (ref 0–200)
Basophils Relative: 0.6 %
Eosinophils Absolute: 203 cells/uL (ref 15–500)
Eosinophils Relative: 2.6 %
HCT: 40.3 % (ref 35.0–45.0)
Hemoglobin: 13 g/dL (ref 11.7–15.5)
Lymphs Abs: 2059.2 cells/uL (ref 850–3900)
MCH: 27.8 pg (ref 27.0–33.0)
MCHC: 32.3 g/dL (ref 32.0–36.0)
MCV: 86.3 fL (ref 80.0–100.0)
MPV: 9.4 fL (ref 7.5–12.5)
Monocytes Relative: 6.2 %
Neutro Abs: 5008 cells/uL (ref 1500–7800)
Neutrophils Relative %: 64.2 %
Platelets: 367 10*3/uL (ref 140–400)
RBC: 4.67 10*6/uL (ref 3.80–5.10)
RDW: 12.8 % (ref 11.0–15.0)
Total Lymphocyte: 26.4 %
WBC: 7.8 10*3/uL (ref 3.8–10.8)

## 2022-09-06 LAB — HEMOGLOBIN A1C
Hgb A1c MFr Bld: 5.6 % of total Hgb (ref <5.7)
Mean Plasma Glucose: 114 mg/dL
eAG (mmol/L): 6.3 mmol/L

## 2022-09-06 LAB — LIPID PANEL
Cholesterol: 204 mg/dL — ABNORMAL HIGH (ref <200)
HDL: 43 mg/dL — ABNORMAL LOW (ref >=50)
LDL Cholesterol (Calc): 135 mg/dL (calc) — ABNORMAL HIGH
Non-HDL Cholesterol (Calc): 161 mg/dL (calc) — ABNORMAL HIGH (ref <130)
Total CHOL/HDL Ratio: 4.7 (calc) (ref <5.0)
Triglycerides: 136 mg/dL (ref <150)

## 2022-09-06 LAB — COMPLETE METABOLIC PANEL WITH GFR
AG Ratio: 1.7 (calc) (ref 1.0–2.5)
ALT: 18 U/L (ref 6–29)
AST: 13 U/L (ref 10–35)
Albumin: 4.3 g/dL (ref 3.6–5.1)
Alkaline phosphatase (APISO): 76 U/L (ref 31–125)
BUN: 8 mg/dL (ref 7–25)
CO2: 27 mmol/L (ref 20–32)
Calcium: 9.5 mg/dL (ref 8.6–10.2)
Chloride: 102 mmol/L (ref 98–110)
Creat: 0.82 mg/dL (ref 0.50–0.99)
Globulin: 2.5 g/dL (calc) (ref 1.9–3.7)
Glucose, Bld: 85 mg/dL (ref 65–99)
Potassium: 4.6 mmol/L (ref 3.5–5.3)
Sodium: 137 mmol/L (ref 135–146)
Total Bilirubin: 0.4 mg/dL (ref 0.2–1.2)
Total Protein: 6.8 g/dL (ref 6.1–8.1)
eGFR: 90 mL/min/{1.73_m2} (ref >=60)

## 2022-09-06 LAB — URINALYSIS, ROUTINE W REFLEX MICROSCOPIC
Bilirubin Urine: NEGATIVE
Glucose, UA: NEGATIVE
Hgb urine dipstick: NEGATIVE
Ketones, ur: NEGATIVE
Leukocytes,Ua: NEGATIVE
Nitrite: NEGATIVE
Protein, ur: NEGATIVE
Specific Gravity, Urine: 1.018 (ref 1.001–1.035)
pH: 5.5 (ref 5.0–8.0)

## 2022-09-06 LAB — MICROALBUMIN / CREATININE URINE RATIO
Creatinine, Urine: 158 mg/dL (ref 20–275)
Microalb Creat Ratio: 3 mg/g creat (ref <30)
Microalb, Ur: 0.5 mg/dL

## 2022-09-06 LAB — MAGNESIUM: Magnesium: 1.8 mg/dL (ref 1.5–2.5)

## 2022-09-06 LAB — VITAMIN D 25 HYDROXY (VIT D DEFICIENCY, FRACTURES): Vit D, 25-Hydroxy: 33 ng/mL (ref 30–100)

## 2022-09-06 LAB — TSH: TSH: 2.09 mIU/L

## 2022-09-08 ENCOUNTER — Ambulatory Visit
Admission: RE | Admit: 2022-09-08 | Discharge: 2022-09-08 | Disposition: A | Discharge status: Home / Self Care | Payer: 59 | Source: Ambulatory Visit

## 2022-09-08 DIAGNOSIS — Z1231 Encounter for screening mammogram for malignant neoplasm of breast: Secondary | ICD-10-CM

## 2022-09-09 ENCOUNTER — Encounter: Payer: Self-pay | Admitting: Gastroenterology

## 2022-09-13 ENCOUNTER — Other Ambulatory Visit: Payer: Self-pay

## 2022-09-14 ENCOUNTER — Other Ambulatory Visit (HOSPITAL_BASED_OUTPATIENT_CLINIC_OR_DEPARTMENT_OTHER): Payer: Self-pay

## 2022-09-26 ENCOUNTER — Other Ambulatory Visit (HOSPITAL_BASED_OUTPATIENT_CLINIC_OR_DEPARTMENT_OTHER): Payer: Self-pay

## 2022-09-27 ENCOUNTER — Other Ambulatory Visit: Payer: Self-pay | Admitting: Nurse Practitioner

## 2022-09-27 ENCOUNTER — Encounter: Payer: Self-pay | Admitting: Nurse Practitioner

## 2022-09-27 ENCOUNTER — Other Ambulatory Visit (HOSPITAL_BASED_OUTPATIENT_CLINIC_OR_DEPARTMENT_OTHER): Payer: Self-pay

## 2022-09-27 DIAGNOSIS — E1169 Type 2 diabetes mellitus with other specified complication: Secondary | ICD-10-CM

## 2022-09-27 MED ORDER — MOUNJARO 5 MG/0.5ML ~~LOC~~ SOAJ: 5.0000 mg | SUBCUTANEOUS | 3 refills | Status: DC

## 2022-09-30 ENCOUNTER — Ambulatory Visit (AMBULATORY_SURGERY_CENTER): Payer: 59 | Admitting: *Deleted

## 2022-09-30 VITALS — Ht 67.0 in | Wt 230.0 lb

## 2022-09-30 DIAGNOSIS — Z1211 Encounter for screening for malignant neoplasm of colon: Secondary | ICD-10-CM

## 2022-09-30 MED ORDER — NA SULFATE-K SULFATE-MG SULF 17.5-3.13-1.6 GM/177ML PO SOLN: 1.0000 | Freq: Once | ORAL | 0 refills | Status: AC

## 2022-09-30 NOTE — Progress Notes (Signed)
Pt's name and DOB verified at the beginning of the pre-visit.  Pt denies any difficulty with ambulating.  No egg or soy allergy known to patient  No issues known to pt with past sedation with any surgeries or proceduresd Patient denies ever being intubated Pt has no issues moving head neck or swallowing No FH of Malignant Hyperthermia Pt is not on diet pills Pt is not on home 02  Pt is not on blood thinners  Pt has vocational issues with constipation RN instructed pt to use Miralax per bottles instructions a week before prep days. Pt states they will Pt is not on dialysis Pt denies any upcoming cardiac testing Pt encouraged to use to use Singlecare or Goodrx to reduce cost  Patient's chart reviewed by Cathlyn Parsons CNRA prior to pre-visit and patient appropriate for the [facility].  Pre-visit completed and red dot placed by patient's name on their procedure day (on provider's schedule).  . Visit by phone Pt states weight is  Instructed pt why it is important to and  to call if they have any changes in health or new medications. Directed them to the # given and on instructions.   Pt states they will.  Instructions reviewed with pt and pt states understanding. Instructed to review again prior to procedure. Pt states they will.  Instructions sent by mail with coupon and by my chart

## 2022-10-03 ENCOUNTER — Encounter: Payer: Self-pay | Admitting: Gastroenterology

## 2022-10-21 ENCOUNTER — Encounter: Payer: 59 | Admitting: Gastroenterology

## 2022-11-16 ENCOUNTER — Encounter: Payer: Self-pay | Admitting: Gastroenterology

## 2022-11-16 ENCOUNTER — Ambulatory Visit (AMBULATORY_SURGERY_CENTER): Payer: 59 | Admitting: Gastroenterology

## 2022-11-16 VITALS — BP 107/71 | HR 92 | Temp 99.1°F | Resp 15 | Ht 67.0 in | Wt 230.0 lb

## 2022-11-16 DIAGNOSIS — Z1211 Encounter for screening for malignant neoplasm of colon: Secondary | ICD-10-CM

## 2022-11-16 MED ORDER — SODIUM CHLORIDE 0.9 % IV SOLN: 500.0000 mL | Freq: Once | INTRAVENOUS | Status: DC

## 2022-11-16 NOTE — Progress Notes (Signed)
Pt's states no medical or surgical changes since previsit or office visit. 

## 2022-11-16 NOTE — Progress Notes (Signed)
Report to PACU, RN, vss, BBS= Clear.  

## 2022-11-16 NOTE — Patient Instructions (Addendum)
Recommendation:           - The patient will be observed post-procedure,                            until all discharge criteria are met.                           - Discharge patient to home.                           - Patient has a contact number available for                            emergencies. The signs and symptoms of potential                            delayed complications were discussed with the                            patient. Return to normal activities tomorrow.                            Written discharge instructions were provided to the                            patient.                           - High fiber diet.                           - Use FiberCon 1-2 tablets PO daily.                           - Repeat colonoscopy in 5 years for screening                            purposes due to significant family history.                           - The findings and recommendations were discussed                            with the patient.                           - The findings and recommendations were discussed                            with the patient's family.  Handouts on high fiber diet, diverticulosis and hemorrhoids given.  YOU HAD AN ENDOSCOPIC PROCEDURE TODAY AT THE [facility]:   Refer to the procedure report that was given to you for any specific questions about what was found during the examination.  If the procedure report does not answer your questions, please call your gastroenterologist to clarify.  If you requested  that your care partner not be given the details of your procedure findings, then the procedure report has been included in a sealed envelope for you to review at your convenience later.  YOU SHOULD EXPECT: Some feelings of bloating in the abdomen. Passage of more gas than usual.  Walking can help get rid of the air that was put into your GI tract during the procedure and reduce the bloating. If you had a lower endoscopy (such as a  colonoscopy or flexible sigmoidoscopy) you may notice spotting of blood in your stool or on the toilet paper. If you underwent a bowel prep for your procedure, you may not have a normal bowel movement for a few days.  Please Note:  You might notice some irritation and congestion in your nose or some drainage.  This is from the oxygen used during your procedure.  There is no need for concern and it should clear up in a day or so.  SYMPTOMS TO REPORT IMMEDIATELY:  Following lower endoscopy (colonoscopy or flexible sigmoidoscopy):  Excessive amounts of blood in the stool  Significant tenderness or worsening of abdominal pains  Swelling of the abdomen that is new, acute  Fever of 100F or higher  For urgent or emergent issues, a gastroenterologist can be reached at any hour by calling [phone]. Do not use MyChart messaging for urgent concerns.    DIET:  We do recommend a small meal at first, but then you may proceed to your regular diet.  Drink plenty of fluids but you should avoid alcoholic beverages for 24 hours.  ACTIVITY:  You should plan to take it easy for the rest of today and you should NOT DRIVE or use heavy machinery until tomorrow (because of the sedation medicines used during the test).    FOLLOW UP: Our staff will call the number listed on your records the next business day following your procedure.  We will call around 7:15- 8:00 am to check on you and address any questions or concerns that you may have regarding the information given to you following your procedure. If we do not reach you, we will leave a message.     If any biopsies were taken you will be contacted by phone or by letter within the next 1-3 weeks.  Please call us at [phone] if you have not heard about the biopsies in 3 weeks.    SIGNATURES/CONFIDENTIALITY: You and/or your care partner have signed paperwork which will be entered into your electronic medical record.  These signatures attest to the  fact that that the information above on your After Visit Summary has been reviewed and is understood.  Full responsibility of the confidentiality of this discharge information lies with you and/or your care-partner.

## 2022-11-16 NOTE — Op Note (Signed)
[facility] Patient Name: Cindy Hooper Procedure Date: 11/16/2022 2:39 PM MRN: [id number] Endoscopist: Corliss Parish , MD, [id number] Age: 47 Referring MD:  Date of Birth: August 03, 1975 Gender: Female Account #: [id number] Procedure:                Colonoscopy Indications:              Screening for colon cancer: Family history of                            colorectal cancer in multiple 2nd degree relatives                            (3 grandparents) Medicines:                Monitored Anesthesia Care Procedure:                Pre-Anesthesia Assessment:                           - Prior to the procedure, a History and Physical                            was performed, and patient medications and                            allergies were reviewed. The patient's tolerance of                            previous anesthesia was also reviewed. The risks                            and benefits of the procedure and the sedation                            options and risks were discussed with the patient.                            All questions were answered, and informed consent                            was obtained. Prior Anticoagulants: The patient has                            taken no anticoagulant or antiplatelet agents. ASA                            Grade Assessment: II - A patient with mild systemic                            disease. After reviewing the risks and benefits,                            the patient was deemed in satisfactory condition to  undergo the procedure.                           After obtaining informed consent, the colonoscope                            was passed under direct vision. Throughout the                            procedure, the patient's blood pressure, pulse, and                            oxygen saturations were monitored continuously. The                            CF [id number] #[id number] was introduced  through the anus                            and advanced to the 3 cm into the ileum. The                            colonoscopy was performed without difficulty. The                            patient tolerated the procedure. The quality of the                            bowel preparation was good. The terminal ileum,                            ileocecal valve, appendiceal orifice, and rectum                            were photographed. Scope In: 2:48:43 PM Scope Out: 2:58:16 PM Scope Withdrawal Time: 0 hours 7 minutes 15 seconds  Total Procedure Duration: 0 hours 9 minutes 33 seconds  Findings:                 Skin tags were found on perianal exam.                           The digital rectal exam findings include                            hemorrhoids. Pertinent negatives include no                            palpable rectal lesions.                           Multiple small-mouthed diverticula were found in                            the recto-sigmoid colon and sigmoid colon.  Normal mucosa was found in the entire colon                            otherwise.                           Non-bleeding non-thrombosed internal hemorrhoids                            were found during retroflexion, during perianal                            exam and during digital exam. The hemorrhoids were                            Grade II (internal hemorrhoids that prolapse but                            reduce spontaneously). Complications:            No immediate complications. Estimated Blood Loss:     Estimated blood loss was minimal. Impression:               - Perianal skin tags found on perianal exam.                           - Hemorrhoids found on digital rectal exam.                           - Diverticulosis in the recto-sigmoid colon and in                            the sigmoid colon.                           - Normal mucosa in the entire examined colon                             otherwise.                           - Non-bleeding non-thrombosed internal hemorrhoids. Recommendation:           - The patient will be observed post-procedure,                            until all discharge criteria are met.                           - Discharge patient to home.                           - Patient has a contact number available for                            emergencies. The signs and symptoms of potential  delayed complications were discussed with the                            patient. Return to normal activities tomorrow.                            Written discharge instructions were provided to the                            patient.                           - High fiber diet.                           - Use FiberCon 1-2 tablets PO daily.                           - Repeat colonoscopy in 5 years for screening                            purposes due to significant family history.                           - The findings and recommendations were discussed                            with the patient.                           - The findings and recommendations were discussed                            with the patient's family. Corliss Parish, MD 11/16/2022 3:02:18 PM

## 2022-11-16 NOTE — Progress Notes (Signed)
GASTROENTEROLOGY PROCEDURE H&P NOTE   Primary Care Physician: Raynelle Dick, NP  HPI: Cindy Hooper is a 47 y.o. female who presents for Colonoscopy for screening.  Past Medical History:  Diagnosis Date   Benign essential HTN 05/2016   New diagnosis as of 05/2016   Diabetes mellitus without complication (HCC)    PCOS (polycystic ovarian syndrome) 2011   Vitamin D deficiency    Past Surgical History:  Procedure Laterality Date   None     Current Outpatient Medications  Medication Sig Dispense Refill   Cholecalciferol (VITAMIN D PO) Take 2,000 Units by mouth daily.     metFORMIN (GLUCOPHAGE) 500 MG tablet Take 500 mg by mouth 2 (two) times daily with a meal.     metoprolol succinate (TOPROL-XL) 100 MG 24 hr tablet Take 1 tablet (100 mg total) by mouth daily. Take with or immediately following a meal. 90 tablet 3   Multiple Vitamin (MULTIVITAMIN) tablet Take 1 tablet by mouth daily.     tirzepatide [facility]) 5 MG/0.5ML Pen Inject 5 mg into the skin once a week. 2 mL 3   Current Facility-Administered Medications  Medication Dose Route Frequency Provider Last Rate Last Admin   0.9 %  sodium chloride infusion  500 mL Intravenous Once Mansouraty, Netty Starring., MD        Current Outpatient Medications:    Cholecalciferol (VITAMIN D PO), Take 2,000 Units by mouth daily., Disp: , Rfl:    metFORMIN (GLUCOPHAGE) 500 MG tablet, Take 500 mg by mouth 2 (two) times daily with a meal., Disp: , Rfl:    metoprolol succinate (TOPROL-XL) 100 MG 24 hr tablet, Take 1 tablet (100 mg total) by mouth daily. Take with or immediately following a meal., Disp: 90 tablet, Rfl: 3   Multiple Vitamin (MULTIVITAMIN) tablet, Take 1 tablet by mouth daily., Disp: , Rfl:    tirzepatide (MOUNJARO) 5 MG/0.5ML Pen, Inject 5 mg into the skin once a week., Disp: 2 mL, Rfl: 3  Current Facility-Administered Medications:    0.9 %  sodium chloride infusion, 500 mL, Intravenous, Once, Mansouraty, Netty Starring.,  MD No Known Allergies Family History  Problem Relation Age of Onset   CAD Mother 32       cardiac arrest while on vacation   Alcohol abuse Father 83   Liver cancer Maternal Grandmother    Colon cancer Maternal Grandmother    CAD Maternal Grandmother 50       pt got a PPM at 36, died at 54   Cancer - Colon Maternal Grandmother    Stomach cancer Maternal Grandfather    Colon cancer Maternal Grandfather    CVA Maternal Grandfather 75   Cancer - Colon Maternal Grandfather    Breast cancer Neg Hx    Colon polyps Neg Hx    Esophageal cancer Neg Hx    Rectal cancer Neg Hx    Social History   Socioeconomic History   Marital status: Married    Spouse name: Not on file   Number of children: Not on file   Years of education: Not on file   Highest education level: Not on file  Occupational History   Occupation: Charity fundraiser for [employer]  Tobacco Use   Smoking status: Never   Smokeless tobacco: Never  Vaping Use   Vaping Use: Never used  Substance and Sexual Activity   Alcohol use: No   Drug use: No   Sexual activity: Yes    Birth control/protection: Condom  Other  Topics Concern   Not on file  Social History Narrative   Pt lives with fiance in [place].   Social Determinants of Health   Financial Resource Strain: Not on file  Food Insecurity: Not on file  Transportation Needs: Not on file  Physical Activity: Not on file  Stress: Not on file  Social Connections: Not on file  Intimate Partner Violence: Not on file    Physical Exam: Today's Vitals   11/16/22 1338 11/16/22 1344  BP: (!) 146/87   Pulse: (!) 115   Temp: 99.1 F (37.3 C) 99.1 F (37.3 C)  TempSrc: Temporal   SpO2: 99%   Weight: 230 lb (104.3 kg)   Height: 5\' 7"  (1.702 m)    Body mass index is 36.02 kg/m. GEN: NAD EYE: Sclerae anicteric ENT: MMM CV: Non-tachycardic GI: Soft, NT/ND NEURO:  Alert & Oriented x 3  Lab Results: No results for input(s): "WBC", "HGB", "HCT", "PLT" in the last 72  hours. BMET No results for input(s): "NA", "K", "CL", "CO2", "GLUCOSE", "BUN", "CREATININE", "CALCIUM" in the last 72 hours. LFT No results for input(s): "PROT", "ALBUMIN", "AST", "ALT", "ALKPHOS", "BILITOT", "BILIDIR", "IBILI" in the last 72 hours. PT/INR No results for input(s): "LABPROT", "INR" in the last 72 hours.   Impression / Plan: This is a 47 y.o.female who presents for Colonoscopy for screening.  The risks and benefits of endoscopic evaluation/treatment were discussed with the patient and/or family; these include but are not limited to the risk of perforation, infection, bleeding, missed lesions, lack of diagnosis, severe illness requiring hospitalization, as well as anesthesia and sedation related illnesses.  The patient's history has been reviewed, patient examined, no change in status, and deemed stable for procedure.  The patient and/or family is agreeable to proceed.    Corliss Parish, MD [facility] Office # [phone]

## 2022-11-17 ENCOUNTER — Telehealth: Payer: Self-pay

## 2022-11-17 NOTE — Telephone Encounter (Signed)
  Follow up Call-     11/16/2022    1:44 PM  Call back number  Post procedure Call Back phone  # [phone]  Permission to leave phone message Yes     Patient questions:  Do you have a fever, pain , or abdominal swelling? No. Pain Score  0 *  Have you tolerated food without any problems? Yes.    Have you been able to return to your normal activities? Yes.    Do you have any questions about your discharge instructions: Diet   No. Medications  No. Follow up visit  No.  Do you have questions or concerns about your Care? No.  Actions: * If pain score is 4 or above: No action needed, pain <4.

## 2022-12-05 NOTE — Progress Notes (Unsigned)
 3 MONTH FOLLOW UP  Assessment and Plan: Cindy Hooper was seen today for annual exam.  Diagnoses and all orders for this visit:  Encounter for general adult medical examination with abnormal findings Due Yearly Mammogram scheduled for 09/08/22 EKG deferred seen last month at cardiology and had EKG  Hyperlipidemia associated with type 2 diabetes mellitus Continue diet and exercise -     COMPLETE METABOLIC PANEL WITH GFR -     Lipid panel  Sinus tachycardia Controlled on Metoprolol 100 mg daily Continue to follow with cardiology  Essential hypertension - continue medications, DASH diet, exercise and monitor at home. Call if greater than 130/80.  -     CBC with Differential/Platelet  PCOS (polycystic ovarian syndrome) Continue Mounjaro  Continue to follow with GYN  Vitamin D deficiency Continue Vit D supplementation to maintain value in therapeutic level of 60-100  -     VITAMIN D 25 Hydroxy (Vit-D Deficiency, Fractures)  Type 2 diabetes mellitus without complication, without long-term current use of insulin Perform daily foot/skin check, notify office of any concerning changes.  Continue to work on diet and exercise Continue Mounjaro but dose is increased to 5 mg once a week SQ -     COMPLETE METABOLIC PANEL WITH GFR -     Hemoglobin A1c  Type 2 diabetes mellitus with obesity Continue diet, exercise and medications -     tirzepatide (MOUNJARO) 5 MG/0.5ML Pen; Inject 5 mg into the skin once a week.  Medication management -     CBC with Differential/Platelet -     COMPLETE METABOLIC PANEL WITH GFR -     Magnesium -     Lipid panel -     TSH -     Hemoglobin A1c -     VITAMIN D 25 Hydroxy (Vit-D Deficiency, Fractures) -     Urinalysis, Routine w reflex microscopic -     Microalbumin / creatinine urine ratio  Screening for hematuria or proteinuria -     Urinalysis, Routine w reflex microscopic -     Microalbumin / creatinine urine ratio  Screening for thyroid disorder -      TSH  Screening for ischemic heart disease - EKG      Over 40 minutes of exam, counseling, chart review, and complex, high level critical decision making was performed this visit.   HPI  47 y.o. female  presents for a complete physical and follow up for has Sinus tachycardia; Changing skin lesion; and Rosacea on their problem list..  She did have an ER visit for tachycardia 07/27/22 - she had a stomach virus and was unable to keep her metoprolol down.  Since that time she has recovered from virus and heart rate has been controlled.  Follows with cardiology, last visit 08/05/22- was to continue on Toprol XL 100 mg daily and follow up with Cindy Hooper in 1 year  Her blood pressure has been controlled at home on Metoprolol 100 mg QD, today their BP is    BP Readings from Last 3 Encounters:  11/16/22 107/71  09/05/22 118/78  08/05/22 130/88  She does workout. She denies chest pain, shortness of breath, dizziness.   BMI is There is no height or weight on file to calculate BMI., she has been working on diet and exercise.She is doing stationary bike 4-5 days a week for 25-30 minutes. Wt Readings from Last 3 Encounters:  11/16/22 230 lb (104.3 kg)  09/30/22 230 lb (104.3 kg)  09/05/22 232 lb (105.2  kg)     She is not on cholesterol medication and denies myalgias. She has a history of elevated cholesterol. Will get a lipid panel today  She has been working on diet and exercise for Type 2 Diabetes, she is not on bASA, she is not on ACE/ARB and denies hyperglycemia, hypoglycemia , and paresthesia of the feet. Will get A1c today.   Last GFR: Lab Results  Component Value Date   GFRNONAA >60 07/27/2022     Patient is not on Vitamin D supplement but has a history of Vitamin D deficiency  Current Medications:  Current Outpatient Medications on File Prior to Visit  Medication Sig Dispense Refill   Cholecalciferol (VITAMIN D PO) Take 2,000 Units by mouth daily.     metFORMIN (GLUCOPHAGE)  500 MG tablet Take 500 mg by mouth 2 (two) times daily with a meal.     metoprolol succinate (TOPROL-XL) 100 MG 24 hr tablet Take 1 tablet (100 mg total) by mouth daily. Take with or immediately following a meal. 90 tablet 3   Multiple Vitamin (MULTIVITAMIN) tablet Take 1 tablet by mouth daily.     tirzepatide [facility]) 5 MG/0.5ML Pen Inject 5 mg into the skin once a week. 2 mL 3   No current facility-administered medications on file prior to visit.   Allergies:  No Known Allergies Medical History:  She has Sinus tachycardia; Changing skin lesion; and Rosacea on their problem list. Health Maintenance:   Immunization History  Administered Date(s) Administered   PFIZER(Purple Top)SARS-COV-2 Vaccination 07/09/2019, 08/05/2019, 03/31/2020, 04/23/2021   Health Maintenance  Topic Date Due   HIV Screening  Never done   Hepatitis C Screening  Never done   DTaP/Tdap/Td (1 - Tdap) Never done   COVID-19 Vaccine (5 - 2023-24 season) 02/04/2022   INFLUENZA VACCINE  01/05/2023   Diabetic kidney evaluation - eGFR measurement  09/05/2023   Diabetic kidney evaluation - Urine ACR  09/05/2023   PAP SMEAR-Modifier  12/09/2024   Colonoscopy  11/15/2032   HPV VACCINES  Aged Out   Mammogram is scheduled for Thursday 09/08/22  Last Dental Exam: Last Eye Exam: Patient Care Team: Cindy Dick, NP as PCP - General (Nurse Practitioner)  Surgical History:  She has a past surgical history that includes None. Family History:  Herfamily history includes Alcohol abuse (age of onset: 25) in her father; CAD (age of onset: 50) in her mother; CAD (age of onset: 4) in her maternal grandmother; CVA (age of onset: 62) in her maternal grandfather; Cancer - Colon in her maternal grandfather and maternal grandmother; Colon cancer in her maternal grandfather and maternal grandmother; Liver cancer in her maternal grandmother; Stomach cancer in her maternal grandfather. Social History:  She reports that she has  never smoked. She has never used smokeless tobacco. She reports that she does not drink alcohol and does not use drugs.  Review of Systems: Review of Systems  Constitutional:  Negative for chills and fever.  HENT:  Negative for congestion, hearing loss, sinus pain, sore throat and tinnitus.   Eyes:  Negative for blurred vision and double vision.  Respiratory:  Negative for cough, hemoptysis, sputum production, shortness of breath and wheezing.   Cardiovascular:  Negative for chest pain, palpitations and leg swelling.  Gastrointestinal:  Negative for abdominal pain, constipation, diarrhea, heartburn, nausea and vomiting.  Genitourinary:  Negative for dysuria and urgency.  Musculoskeletal:  Negative for back pain, falls, joint pain, myalgias and neck pain.  Skin:  Negative for rash.  Neurological:  Negative for dizziness, tingling, tremors, weakness and headaches.  Endo/Heme/Allergies:  Does not bruise/bleed easily.  Psychiatric/Behavioral:  Negative for depression and suicidal ideas. The patient is not nervous/anxious and does not have insomnia.     Physical Exam: Estimated body mass index is 36.02 kg/m as calculated from the following:   Height as of 11/16/22: 5\' 7"  (1.702 m).   Weight as of 11/16/22: 230 lb (104.3 kg). There were no vitals taken for this visit. General Appearance: Well nourished, in no apparent distress.  Eyes: PERRLA, EOMs, conjunctiva no swelling or erythema, normal fundi and vessels.  Sinuses: No Frontal/maxillary tenderness  ENT/Mouth: Ext aud canals clear, normal light reflex with TMs without erythema, bulging. Good dentition. No erythema, swelling, or exudate on post pharynx. Tonsils not swollen or erythematous. Hearing normal.  Neck: Supple, thyroid normal. No bruits  Respiratory: Respiratory effort normal, BS equal bilaterally without rales, rhonchi, wheezing or stridor.  Cardio: RRR without murmurs, rubs or gallops. Brisk peripheral pulses without edema.   Chest: symmetric, with normal excursions and percussion.  Breasts: Symmetric, without lumps, nipple discharge, retractions.  Abdomen: Soft, nontender, no guarding, rebound, hernias, masses, or organomegaly.  Lymphatics: Non tender without lymphadenopathy.  Genitourinary:  Musculoskeletal: Full ROM all peripheral extremities,5/5 strength, and normal gait.  Skin: Warm, dry without rashes, lesions, ecchymosis. Neuro: Cranial nerves intact, reflexes equal bilaterally. Normal muscle tone, no cerebellar symptoms. Sensation intact.  Psych: Awake and oriented X 3, normal affect, Insight and Judgment appropriate.   EKG: deferred had at cardiology last month   Idelia Caudell E  12:03 PM [facility]

## 2022-12-06 ENCOUNTER — Ambulatory Visit (INDEPENDENT_AMBULATORY_CARE_PROVIDER_SITE_OTHER): Payer: 59 | Admitting: Nurse Practitioner

## 2022-12-06 ENCOUNTER — Encounter: Payer: Self-pay | Admitting: Nurse Practitioner

## 2022-12-06 VITALS — BP 102/70 | HR 88 | Temp 97.5°F | Ht 67.0 in | Wt 227.2 lb

## 2022-12-06 DIAGNOSIS — Z0001 Encounter for general adult medical examination with abnormal findings: Secondary | ICD-10-CM

## 2022-12-06 DIAGNOSIS — E119 Type 2 diabetes mellitus without complications: Secondary | ICD-10-CM | POA: Diagnosis not present

## 2022-12-06 DIAGNOSIS — Z1389 Encounter for screening for other disorder: Secondary | ICD-10-CM

## 2022-12-06 DIAGNOSIS — I10 Essential (primary) hypertension: Secondary | ICD-10-CM | POA: Diagnosis not present

## 2022-12-06 DIAGNOSIS — Z1329 Encounter for screening for other suspected endocrine disorder: Secondary | ICD-10-CM

## 2022-12-06 DIAGNOSIS — E282 Polycystic ovarian syndrome: Secondary | ICD-10-CM | POA: Diagnosis not present

## 2022-12-06 DIAGNOSIS — E669 Obesity, unspecified: Secondary | ICD-10-CM

## 2022-12-06 DIAGNOSIS — E785 Hyperlipidemia, unspecified: Secondary | ICD-10-CM

## 2022-12-06 DIAGNOSIS — R000 Tachycardia, unspecified: Secondary | ICD-10-CM

## 2022-12-06 DIAGNOSIS — Z136 Encounter for screening for cardiovascular disorders: Secondary | ICD-10-CM

## 2022-12-06 DIAGNOSIS — Z79899 Other long term (current) drug therapy: Secondary | ICD-10-CM

## 2022-12-06 DIAGNOSIS — E1169 Type 2 diabetes mellitus with other specified complication: Secondary | ICD-10-CM | POA: Diagnosis not present

## 2022-12-06 DIAGNOSIS — E559 Vitamin D deficiency, unspecified: Secondary | ICD-10-CM

## 2022-12-06 MED ORDER — TIRZEPATIDE 7.5 MG/0.5ML ~~LOC~~ SOAJ
7.5000 mg | SUBCUTANEOUS | 3 refills | Status: DC
Start: 2022-12-06 — End: 2023-03-14

## 2022-12-06 NOTE — Patient Instructions (Signed)
Shoulder Pain Many things can cause shoulder pain, including: An injury to the shoulder. Overuse of the shoulder. Arthritis. The source of the pain can be: Inflammation. An injury to the shoulder joint. An injury to a tendon, ligament, or bone. Follow these instructions at home: Pay attention to changes in your symptoms. Let your health care provider know about them. Follow these instructions to relieve your pain. If you have a removable sling: Wear the sling as told by your provider. Remove it only as told by your provider. Check the skin around the sling every day. Tell your provider about any concerns. Loosen the sling if your fingers tingle, become numb, or become cold. Keep the sling clean. If the sling is not waterproof: Do not let it get wet. Remove it to shower or bathe. Move your arm as little as possible, but keep your hand moving to prevent swelling. Managing pain, stiffness, and swelling  If told, put ice on the painful area. If you have a removable sling or immobilizer, remove it as told by your provider. Put ice in a plastic bag. Place a towel between your skin and the bag. Leave the ice on for 20 minutes, 2-3 times a day. If your skin turns bright red, remove the ice right away to prevent skin damage. The risk of damage is higher if you cannot feel pain, heat, or cold. Move your fingers often to reduce stiffness and swelling. Squeeze a soft ball or a foam pad as much as possible. This helps to keep the shoulder from swelling. It also helps to strengthen the arm. General instructions Take over-the-counter and prescription medicines only as told by your provider. Exercise may help with pain management. Perform exercises if told by your provider. You may be referred to a physical therapist to help in your recovery process. Keep all follow-up visits in order to avoid any type of permanent shoulder disability or chronic pain problems. Contact a health care provider  if: Your pain is not relieved with medicines. New pain develops in your arm, hand, or fingers. You loosen your sling and your arm, hand, or fingers remain tingly, numb, swollen, or painful. Get help right away if: Your arm, hand, or fingers turn white or blue. This information is not intended to replace advice given to you by your health care provider. Make sure you discuss any questions you have with your health care provider. Document Revised: 12/24/2021 Document Reviewed: 12/24/2021 Elsevier Patient Education  2024 [employer]  

## 2022-12-07 ENCOUNTER — Other Ambulatory Visit: Payer: Self-pay | Admitting: Nurse Practitioner

## 2022-12-07 DIAGNOSIS — E1169 Type 2 diabetes mellitus with other specified complication: Secondary | ICD-10-CM

## 2022-12-07 LAB — COMPLETE METABOLIC PANEL WITH GFR
AG Ratio: 1.8 (calc) (ref 1.0–2.5)
ALT: 15 U/L (ref 6–29)
AST: 10 U/L (ref 10–35)
Albumin: 4.6 g/dL (ref 3.6–5.1)
Alkaline phosphatase (APISO): 69 U/L (ref 31–125)
BUN: 9 mg/dL (ref 7–25)
CO2: 26 mmol/L (ref 20–32)
Calcium: 9.9 mg/dL (ref 8.6–10.2)
Chloride: 104 mmol/L (ref 98–110)
Creat: 0.82 mg/dL (ref 0.50–0.99)
Globulin: 2.5 g/dL (calc) (ref 1.9–3.7)
Glucose, Bld: 83 mg/dL (ref 65–99)
Potassium: 4.6 mmol/L (ref 3.5–5.3)
Sodium: 138 mmol/L (ref 135–146)
Total Bilirubin: 0.4 mg/dL (ref 0.2–1.2)
Total Protein: 7.1 g/dL (ref 6.1–8.1)
eGFR: 89 mL/min/{1.73_m2} (ref >=60)

## 2022-12-07 LAB — CBC WITH DIFFERENTIAL/PLATELET
Absolute Monocytes: 676 cells/uL (ref 200–950)
Basophils Absolute: 39 cells/uL (ref 0–200)
Basophils Relative: 0.4 %
Eosinophils Absolute: 510 cells/uL — ABNORMAL HIGH (ref 15–500)
Eosinophils Relative: 5.2 %
HCT: 40.8 % (ref 35.0–45.0)
Hemoglobin: 13.6 g/dL (ref 11.7–15.5)
Lymphs Abs: 2548 cells/uL (ref 850–3900)
MCH: 28.3 pg (ref 27.0–33.0)
MCHC: 33.3 g/dL (ref 32.0–36.0)
MCV: 85 fL (ref 80.0–100.0)
MPV: 9.5 fL (ref 7.5–12.5)
Monocytes Relative: 6.9 %
Neutro Abs: 6027 cells/uL (ref 1500–7800)
Neutrophils Relative %: 61.5 %
Platelets: 353 10*3/uL (ref 140–400)
RBC: 4.8 10*6/uL (ref 3.80–5.10)
RDW: 13 % (ref 11.0–15.0)
Total Lymphocyte: 26 %
WBC: 9.8 10*3/uL (ref 3.8–10.8)

## 2022-12-07 LAB — LIPID PANEL
Cholesterol: 189 mg/dL (ref <200)
HDL: 44 mg/dL — ABNORMAL LOW (ref >=50)
LDL Cholesterol (Calc): 122 mg/dL (calc) — ABNORMAL HIGH
Non-HDL Cholesterol (Calc): 145 mg/dL (calc) — ABNORMAL HIGH (ref <130)
Total CHOL/HDL Ratio: 4.3 (calc) (ref <5.0)
Triglycerides: 119 mg/dL (ref <150)

## 2022-12-07 LAB — HEMOGLOBIN A1C
Hgb A1c MFr Bld: 5.6 % of total Hgb (ref <5.7)
Mean Plasma Glucose: 114 mg/dL
eAG (mmol/L): 6.3 mmol/L

## 2022-12-07 MED ORDER — ROSUVASTATIN CALCIUM 5 MG PO TABS
5.0000 mg | ORAL_TABLET | Freq: Every day | ORAL | 11 refills | Status: DC
Start: 2022-12-07 — End: 2023-12-11

## 2023-03-13 NOTE — Progress Notes (Unsigned)
3 MONTH FOLLOW UP  Assessment and Plan:   Hyperlipidemia associated with type 2 diabetes mellitus Continue diet and exercise If remains elevated will start low dose statin -     COMPLETE METABOLIC PANEL WITH GFR -     Lipid panel  Sinus tachycardia Controlled on Metoprolol 100 mg daily Continue to follow with cardiology  Essential hypertension - continue medications, DASH diet, exercise and monitor at home. Call if greater than 130/80.  -     CBC with Differential/Platelet  PCOS (polycystic ovarian syndrome) Increase Mounjaro to 10 mg SQ QW Continue to follow with GYN  Vitamin D deficiency Continue Vit D supplementation to maintain value in therapeutic level of 60-100    Type 2 diabetes mellitus without complication, without long-term current use of insulin Perform daily foot/skin check, notify office of any concerning changes.  Continue to work on diet and exercise Currently on Mounjaro 7.5 mg QW, will increase to 10 mg SQ QW -     COMPLETE METABOLIC PANEL WITH GFR -     Hemoglobin A1c  Type 2 diabetes mellitus with obesity Continue diet, exercise and medications Increase Mounjaro to 10 mg SQ QW  Medication management -     CBC with Differential/Platelet -     COMPLETE METABOLIC PANEL WITH GFR -     Lipid panel -     Hemoglobin A1C w/out eAG  Flu vaccine need -     Fluzone Trivalent Flu Vaccine (Muli dose preparattion)       Over 40 minutes of exam, counseling, chart review, and complex, high level critical decision making was performed this visit.   HPI  47 y.o. female  presents for a 3 month follow up for has Sinus tachycardia; Changing skin lesion; Rosacea; Hyperlipidemia associated with type 2 diabetes mellitus (HCC); and Essential hypertension on their problem list..   Her blood pressure has been controlled at home on Metoprolol 100 mg QD, today their BP is BP: 106/72  BP Readings from Last 3 Encounters:  03/14/23 106/72  12/06/22 102/70  11/16/22  107/71  She does workout. She denies chest pain, shortness of breath, dizziness.   BMI is Body mass index is 35.93 kg/m., she has been working on diet and exercise.She is doing stationary bike 4-5 days a week for 25-30 minutes. She is limiting simple carbs and increasing lean protein.  Wt Readings from Last 3 Encounters:  03/14/23 229 lb 6.4 oz (104.1 kg)  12/06/22 227 lb 3.2 oz (103.1 kg)  11/16/22 230 lb (104.3 kg)    She is on cholesterol medication, Rosuvastatin 5 mg every day- started 12/07/22  . Cholesterol is not at goal.Limiting red meat, dairy, eggs and fried foods.  Last lipid: Lab Results  Component Value Date   CHOL 189 12/06/2022   HDL 44 (L) 12/06/2022   LDLCALC 122 (H) 12/06/2022   TRIG 119 12/06/2022   CHOLHDL 4.3 12/06/2022     She has been working on diet and exercise for Type 2 Diabetes, she is not on bASA, she is not on ACE/ARB and denies hyperglycemia, hypoglycemia , and paresthesia of the feet. Will get A1c today. Fasting blood sugars 80-90's  Lab Results  Component Value Date   HGBA1C 5.6 12/06/2022    She is drinking lots of water. Last GFR: Lab Results  Component Value Date   EGFR 89 12/06/2022    Colonoscopy showed diverticulosis, colonoscopy 11/16/22 repeat due in 5 years  Patient is  on Vitamin D supplement 2000  units daily,  has a history of Vitamin D deficiency Lab Results  Component Value Date   VD25OH 33 09/05/2022      Current Medications:  Current Outpatient Medications on File Prior to Visit  Medication Sig Dispense Refill   Cholecalciferol (VITAMIN D PO) Take 2,000 Units by mouth daily.     FIBER ADULT GUMMIES PO Take by mouth.     metoprolol succinate (TOPROL-XL) 100 MG 24 hr tablet Take 1 tablet (100 mg total) by mouth daily. Take with or immediately following a meal. 90 tablet 3   Multiple Vitamin (MULTIVITAMIN) tablet Take 1 tablet by mouth daily.     rosuvastatin (CRESTOR) 5 MG tablet Take 1 tablet (5 mg total) by mouth daily. 30  tablet 11   tirzepatide (MOUNJARO) 7.5 MG/0.5ML Pen Inject 7.5 mg into the skin once a week. 2 mL 3   metFORMIN (GLUCOPHAGE) 500 MG tablet Take 500 mg by mouth 2 (two) times daily with a meal. (Patient not taking: Reported on 03/14/2023)     No current facility-administered medications on file prior to visit.   Allergies:  No Known Allergies Medical History:  She has Sinus tachycardia; Changing skin lesion; Rosacea; Hyperlipidemia associated with type 2 diabetes mellitus (HCC); and Essential hypertension on their problem list. Health Maintenance:   Immunization History  Administered Date(s) Administered   PFIZER(Purple Top)SARS-COV-2 Vaccination 07/09/2019, 08/05/2019, 03/31/2020, 04/23/2021   Health Maintenance  Topic Date Due   FOOT EXAM  Never done   OPHTHALMOLOGY EXAM  Never done   HIV Screening  Never done   Hepatitis C Screening  Never done   DTaP/Tdap/Td (1 - Tdap) Never done   INFLUENZA VACCINE  Never done   COVID-19 Vaccine (5 - 2023-24 season) 02/05/2023   HEMOGLOBIN A1C  06/08/2023   Diabetic kidney evaluation - Urine ACR  09/05/2023   Diabetic kidney evaluation - eGFR measurement  12/06/2023   Cervical Cancer Screening (HPV/Pap Cotest)  12/10/2026   Colonoscopy  11/15/2032   HPV VACCINES  Aged Out      Patient Care Team: Raynelle Dick, NP as PCP - General (Nurse Practitioner)  Surgical History:  She has a past surgical history that includes None. Family History:  Herfamily history includes Alcohol abuse (age of onset: 14) in her father; CAD (age of onset: 45) in her mother; CAD (age of onset: 56) in her maternal grandmother; CVA (age of onset: 4) in her maternal grandfather; Cancer - Colon in her maternal grandfather and maternal grandmother; Colon cancer in her maternal grandfather and maternal grandmother; Liver cancer in her maternal grandmother; Stomach cancer in her maternal grandfather. Social History:  She reports that she has never smoked. She has  never used smokeless tobacco. She reports that she does not drink alcohol and does not use drugs.  Review of Systems: Review of Systems  Constitutional:  Negative for chills and fever.  HENT:  Negative for congestion, hearing loss, sinus pain, sore throat and tinnitus.   Eyes:  Negative for blurred vision and double vision.  Respiratory:  Negative for cough, hemoptysis, sputum production, shortness of breath and wheezing.   Cardiovascular:  Negative for chest pain, palpitations and leg swelling.  Gastrointestinal:  Negative for abdominal pain, constipation, diarrhea, heartburn, nausea and vomiting.  Genitourinary:  Negative for dysuria and urgency.  Musculoskeletal:  Negative for back pain, falls, joint pain, myalgias and neck pain.  Skin:  Negative for rash.  Neurological:  Negative for dizziness, tingling, tremors, weakness and headaches.  Endo/Heme/Allergies:  Does not bruise/bleed easily.  Psychiatric/Behavioral:  Negative for depression and suicidal ideas. The patient is not nervous/anxious and does not have insomnia.     Physical Exam: Estimated body mass index is 35.93 kg/m as calculated from the following:   Height as of this encounter: 5\' 7"  (1.702 m).   Weight as of this encounter: 229 lb 6.4 oz (104.1 kg). BP 106/72   Pulse 94   Temp (!) 97.3 F (36.3 C)   Ht 5\' 7"  (1.702 m)   Wt 229 lb 6.4 oz (104.1 kg)   SpO2 99%   BMI 35.93 kg/m  General Appearance: Well nourished, in no apparent distress.  Eyes: PERRLA, EOMs, conjunctiva no swelling or erythema Sinuses: No Frontal/maxillary tenderness  ENT/Mouth: Ext aud canals clear, normal light reflex with TMs without erythema, bulging. Good dentition. No erythema, swelling, or exudate on post pharynx. Hearing normal.  Neck: Supple, thyroid normal. No bruits  Respiratory: Respiratory effort normal, BS equal bilaterally without rales, rhonchi, wheezing or stridor.  Cardio: RRR without murmurs, rubs or gallops. Brisk peripheral  pulses without edema.  Abdomen: Soft, nontender, + BS.  Lymphatics: Non tender without lymphadenopathy.  Musculoskeletal: Full ROM all peripheral extremities,5/5 strength, and normal gait.  Skin: Warm, dry without rashes, lesions, ecchymosis. Neuro: Cranial nerves intact, reflexes equal bilaterally. Normal muscle tone, no cerebellar symptoms. Sensation intact. Psych: Awake and oriented X 3, normal affect, Insight and Judgment appropriate.      Cindy Hooper Hollie Salk 3:38 PM [facility]

## 2023-03-14 ENCOUNTER — Encounter: Payer: Self-pay | Admitting: Nurse Practitioner

## 2023-03-14 ENCOUNTER — Ambulatory Visit (INDEPENDENT_AMBULATORY_CARE_PROVIDER_SITE_OTHER): Payer: 59 | Admitting: Nurse Practitioner

## 2023-03-14 VITALS — BP 106/72 | HR 94 | Temp 97.3°F | Ht 67.0 in | Wt 229.4 lb

## 2023-03-14 DIAGNOSIS — I10 Essential (primary) hypertension: Secondary | ICD-10-CM

## 2023-03-14 DIAGNOSIS — E559 Vitamin D deficiency, unspecified: Secondary | ICD-10-CM

## 2023-03-14 DIAGNOSIS — E1122 Type 2 diabetes mellitus with diabetic chronic kidney disease: Secondary | ICD-10-CM

## 2023-03-14 DIAGNOSIS — E669 Obesity, unspecified: Secondary | ICD-10-CM

## 2023-03-14 DIAGNOSIS — N182 Chronic kidney disease, stage 2 (mild): Secondary | ICD-10-CM

## 2023-03-14 DIAGNOSIS — Z79899 Other long term (current) drug therapy: Secondary | ICD-10-CM

## 2023-03-14 DIAGNOSIS — E785 Hyperlipidemia, unspecified: Secondary | ICD-10-CM

## 2023-03-14 DIAGNOSIS — E1169 Type 2 diabetes mellitus with other specified complication: Secondary | ICD-10-CM

## 2023-03-14 DIAGNOSIS — Z23 Encounter for immunization: Secondary | ICD-10-CM

## 2023-03-14 DIAGNOSIS — R000 Tachycardia, unspecified: Secondary | ICD-10-CM

## 2023-03-14 DIAGNOSIS — E282 Polycystic ovarian syndrome: Secondary | ICD-10-CM

## 2023-03-14 MED ORDER — MOUNJARO 10 MG/0.5ML ~~LOC~~ SOAJ
10.0000 mg | SUBCUTANEOUS | 1 refills | Status: DC
Start: 2023-03-14 — End: 2023-05-24

## 2023-03-14 NOTE — Patient Instructions (Signed)

## 2023-03-15 LAB — CBC WITH DIFFERENTIAL/PLATELET
Absolute Monocytes: 733 {cells}/uL (ref 200–950)
Basophils Absolute: 67 {cells}/uL (ref 0–200)
Basophils Relative: 0.6 %
Eosinophils Absolute: 255 {cells}/uL (ref 15–500)
Eosinophils Relative: 2.3 %
HCT: 43.1 % (ref 35.0–45.0)
Hemoglobin: 14.1 g/dL (ref 11.7–15.5)
Lymphs Abs: 2897 {cells}/uL (ref 850–3900)
MCH: 28.9 pg (ref 27.0–33.0)
MCHC: 32.7 g/dL (ref 32.0–36.0)
MCV: 88.3 fL (ref 80.0–100.0)
MPV: 9.4 fL (ref 7.5–12.5)
Monocytes Relative: 6.6 %
Neutro Abs: 7148 {cells}/uL (ref 1500–7800)
Neutrophils Relative %: 64.4 %
Platelets: 353 10*3/uL (ref 140–400)
RBC: 4.88 10*6/uL (ref 3.80–5.10)
RDW: 13.1 % (ref 11.0–15.0)
Total Lymphocyte: 26.1 %
WBC: 11.1 10*3/uL — ABNORMAL HIGH (ref 3.8–10.8)

## 2023-03-15 LAB — COMPLETE METABOLIC PANEL WITH GFR
AG Ratio: 1.8 (calc) (ref 1.0–2.5)
ALT: 30 U/L — ABNORMAL HIGH (ref 6–29)
AST: 15 U/L (ref 10–35)
Albumin: 4.6 g/dL (ref 3.6–5.1)
Alkaline phosphatase (APISO): 78 U/L (ref 31–125)
BUN: 9 mg/dL (ref 7–25)
CO2: 26 mmol/L (ref 20–32)
Calcium: 9.8 mg/dL (ref 8.6–10.2)
Chloride: 103 mmol/L (ref 98–110)
Creat: 0.74 mg/dL (ref 0.50–0.99)
Globulin: 2.6 g/dL (ref 1.9–3.7)
Glucose, Bld: 89 mg/dL (ref 65–99)
Potassium: 4.1 mmol/L (ref 3.5–5.3)
Sodium: 137 mmol/L (ref 135–146)
Total Bilirubin: 0.5 mg/dL (ref 0.2–1.2)
Total Protein: 7.2 g/dL (ref 6.1–8.1)
eGFR: 101 mL/min/{1.73_m2} (ref >=60)

## 2023-03-15 LAB — LIPID PANEL
Cholesterol: 163 mg/dL (ref <200)
HDL: 47 mg/dL — ABNORMAL LOW (ref >=50)
LDL Cholesterol (Calc): 96 mg/dL
Non-HDL Cholesterol (Calc): 116 mg/dL (ref <130)
Total CHOL/HDL Ratio: 3.5 (calc) (ref <5.0)
Triglycerides: 108 mg/dL (ref <150)

## 2023-03-15 LAB — HEMOGLOBIN A1C W/OUT EAG: Hgb A1c MFr Bld: 5.7 %{Hb} — ABNORMAL HIGH (ref <5.7)

## 2023-03-31 ENCOUNTER — Other Ambulatory Visit: Payer: Self-pay | Admitting: Nurse Practitioner

## 2023-03-31 DIAGNOSIS — E1169 Type 2 diabetes mellitus with other specified complication: Secondary | ICD-10-CM

## 2023-03-31 DIAGNOSIS — Z79899 Other long term (current) drug therapy: Secondary | ICD-10-CM

## 2023-05-23 ENCOUNTER — Other Ambulatory Visit: Payer: Self-pay | Admitting: Nurse Practitioner

## 2023-05-23 DIAGNOSIS — E669 Obesity, unspecified: Secondary | ICD-10-CM

## 2023-06-14 NOTE — Progress Notes (Signed)
 3 MONTH FOLLOW UP  Assessment and Plan:   Hyperlipidemia associated with type 2 diabetes mellitus Continue diet and exercise Continue Rosuvastatin  5 mg every day -     COMPLETE METABOLIC PANEL WITH GFR -     Lipid panel  Sinus tachycardia Controlled on Metoprolol  100 mg daily Continue to follow with cardiology  Essential hypertension - continue medications- Metoprolol  100 mg every day - Continue DASH diet, exercise and monitor at home. Call if greater than 130/80.  -     CBC with Differential/Platelet  PCOS (polycystic ovarian syndrome) Increase Mounjaro  to 10 mg SQ QW Continue to follow with GYN  Vitamin D  deficiency Continue Vit D supplementation to maintain value in therapeutic level of 60-100   Pendulous breasts/chronic neck pain Continue to work on weight loss May need referral to plastic surgery for reduction  Type 2 diabetes mellitus with stage 1 CKD  Perform daily foot/skin check, notify office of any concerning changes.  Continue to work on diet and exercise Continue Mounjaro  10 mg SQ QW -     COMPLETE METABOLIC PANEL WITH GFR -     Hemoglobin A1c  Type 2 diabetes mellitus with obesity Continue diet, exercise and medications Increase Mounjaro  to 10 mg SQ QW  Medication management -     CBC with Differential/Platelet -     COMPLETE METABOLIC PANEL WITH GFR -     Lipid panel -     Hemoglobin A1C w/out eAG - Magnesium  Elevated LFT Work on diet, exercise and weight loss.  Limit Tylenol and alcohol.   - CMP  Future Appointments  Date Time Provider Department Center  09/14/2023  3:00 PM Gracieann Stannard E, NP [facility] None         Over 40 minutes of exam, counseling, chart review, and complex, high level critical decision making was performed this visit.   HPI  48 y.o. female  presents for a 3 month follow up for has Sinus tachycardia; Changing skin lesion; Rosacea; Hyperlipidemia associated with type 2 diabetes mellitus (HCC); and Essential  hypertension on their problem list..  She does deal with back and neck pain. She does have large breasts, divots in shoulders from bra. Rash under both breasts.   Her blood pressure has been controlled at home on Metoprolol  100 mg QD, today their BP is BP: 120/80  BP Readings from Last 3 Encounters:  06/15/23 120/80  03/14/23 106/72  12/06/22 102/70  She does workout. She denies chest pain, shortness of breath, dizziness.   BMI is Body mass index is 34.86 kg/m., she has been working on diet and exercise.She is doing stationary bike 4-5 days a week for 25-30 minutes and walks 3-4 times a week. . She is limiting simple carbs and increasing lean protein. She is on Mounjaro  10 mg SQ QW. She is down 7 pounds in the past 3 months denies constipation and GI side effects Wt Readings from Last 3 Encounters:  06/15/23 222 lb 9.6 oz (101 kg)  03/14/23 229 lb 6.4 oz (104.1 kg)  12/06/22 227 lb 3.2 oz (103.1 kg)    She is on cholesterol medication, Rosuvastatin  5 mg every day- started 12/07/22  . Cholesterol is not at goal.Limiting red meat, dairy, eggs and fried foods.  Last lipid: Lab Results  Component Value Date   CHOL 163 03/14/2023   HDL 47 (L) 03/14/2023   LDLCALC 96 03/14/2023   TRIG 108 03/14/2023   CHOLHDL 3.5 03/14/2023     She has  been working on diet and exercise for Type 2 Diabetes, she is not on bASA, she is not on ACE/ARB and denies hyperglycemia, hypoglycemia , and paresthesia of the feet. Will get A1c today. Fasting blood sugars 100-105  Continues of Mounjaro  10 mg QW Lab Results  Component Value Date   HGBA1C 5.7 (H) 03/14/2023    She is drinking lots of water. Last GFR: Lab Results  Component Value Date   EGFR 101 03/14/2023    Colonoscopy showed diverticulosis, colonoscopy 11/16/22 repeat due in 5 years  Patient is  on Vitamin D  supplement 2000 units daily,  has a history of Vitamin D  deficiency Lab Results  Component Value Date   VD25OH 33 09/05/2022      Current  Medications:  Current Outpatient Medications on File Prior to Visit  Medication Sig Dispense Refill   Cholecalciferol (VITAMIN D  PO) Take 2,000 Units by mouth daily.     FIBER ADULT GUMMIES PO Take by mouth.     metoprolol  succinate (TOPROL -XL) 100 MG 24 hr tablet Take 1 tablet (100 mg total) by mouth daily. Take with or immediately following a meal. 90 tablet 3   Multiple Vitamin (MULTIVITAMIN) tablet Take 1 tablet by mouth daily.     rosuvastatin  (CRESTOR ) 5 MG tablet Take 1 tablet (5 mg total) by mouth daily. 30 tablet 11   tirzepatide  (MOUNJARO ) 10 MG/0.5ML Pen INJECT 1 SYRINGE ONCE A WEEK 6 mL 0   No current facility-administered medications on file prior to visit.   Allergies:  No Known Allergies Medical History:  She has Sinus tachycardia; Changing skin lesion; Rosacea; Hyperlipidemia associated with type 2 diabetes mellitus (HCC); and Essential hypertension on their problem list. Health Maintenance:   Immunization History  Administered Date(s) Administered   Fluzone  Influenza virus vaccine,trivalent (IIV3), split virus 03/14/2023   PFIZER(Purple Top)SARS-COV-2 Vaccination 07/09/2019, 08/05/2019, 03/31/2020, 04/23/2021   Health Maintenance  Topic Date Due   OPHTHALMOLOGY EXAM  Never done   HIV Screening  Never done   Hepatitis C Screening  Never done   DTaP/Tdap/Td (1 - Tdap) Never done   COVID-19 Vaccine (5 - 2024-25 season) 02/05/2023   Diabetic kidney evaluation - Urine ACR  09/05/2023   HEMOGLOBIN A1C  09/12/2023   Diabetic kidney evaluation - eGFR measurement  03/13/2024   FOOT EXAM  03/13/2024   Cervical Cancer Screening (HPV/Pap Cotest)  12/10/2026   Colonoscopy  11/15/2032   INFLUENZA VACCINE  Completed   HPV VACCINES  Aged Out       Patient Care Team: Welles Walthall E, NP as PCP - General (Nurse Practitioner)  Surgical History:  She has a past surgical history that includes None. Family History:  Herfamily history includes Alcohol abuse (age of onset:  64) in her father; CAD (age of onset: 30) in her mother; CAD (age of onset: 69) in her maternal grandmother; CVA (age of onset: 41) in her maternal grandfather; Cancer - Colon in her maternal grandfather and maternal grandmother; Colon cancer in her maternal grandfather and maternal grandmother; Liver cancer in her maternal grandmother; Stomach cancer in her maternal grandfather. Social History:  She reports that she has never smoked. She has never used smokeless tobacco. She reports that she does not drink alcohol and does not use drugs.  Review of Systems: Review of Systems  Constitutional:  Negative for chills and fever.  HENT:  Negative for congestion, hearing loss, sinus pain, sore throat and tinnitus.   Eyes:  Negative for blurred vision and double vision.  Respiratory:  Negative for cough, hemoptysis, sputum production, shortness of breath and wheezing.   Cardiovascular:  Negative for chest pain, palpitations and leg swelling.  Gastrointestinal:  Negative for abdominal pain, constipation, diarrhea, heartburn, nausea and vomiting.  Genitourinary:  Negative for dysuria and urgency.  Musculoskeletal:  Negative for back pain, falls, joint pain, myalgias and neck pain.  Skin:  Negative for rash.  Neurological:  Negative for dizziness, tingling, tremors, weakness and headaches.  Endo/Heme/Allergies:  Does not bruise/bleed easily.  Psychiatric/Behavioral:  Negative for depression and suicidal ideas. The patient is not nervous/anxious and does not have insomnia.     Physical Exam: Estimated body mass index is 34.86 kg/m as calculated from the following:   Height as of this encounter: 5' 7 (1.702 m).   Weight as of this encounter: 222 lb 9.6 oz (101 kg). BP 120/80   Pulse 96   Temp 97.9 F (36.6 C)   Resp 16   Ht 5' 7 (1.702 m)   Wt 222 lb 9.6 oz (101 kg)   SpO2 98%   BMI 34.86 kg/m  General Appearance: Well nourished, in no apparent distress.  Eyes: PERRLA, EOMs, conjunctiva no  swelling or erythema Sinuses: No Frontal/maxillary tenderness  ENT/Mouth: Ext aud canals clear, normal light reflex with TMs without erythema, bulging. Good dentition. No erythema, swelling, or exudate on post pharynx. Hearing normal.  Neck: Supple, thyroid  normal. No bruits  Respiratory: Respiratory effort normal, BS equal bilaterally without rales, rhonchi, wheezing or stridor.  Chest: large pendulous breasts, bra marks of shoulders, rash under breasts bilaterally Cardio: RRR without murmurs, rubs or gallops. Brisk peripheral pulses without edema.  Abdomen: Soft, nontender, + BS.  Lymphatics: Non tender without lymphadenopathy.  Musculoskeletal: Full ROM all peripheral extremities,5/5 strength, and normal gait.  Skin: Warm, dry without rashes, lesions, ecchymosis. Neuro: Cranial nerves intact, reflexes equal bilaterally. Normal muscle tone, no cerebellar symptoms. Sensation intact. Psych: Awake and oriented X 3, normal affect, Insight and Judgment appropriate.      Medea Deines E Wilmore Holsomback 4:11 PM [facility]

## 2023-06-15 ENCOUNTER — Encounter: Payer: Self-pay | Admitting: Nurse Practitioner

## 2023-06-15 ENCOUNTER — Ambulatory Visit (INDEPENDENT_AMBULATORY_CARE_PROVIDER_SITE_OTHER): Payer: 59 | Admitting: Nurse Practitioner

## 2023-06-15 VITALS — BP 120/80 | HR 96 | Temp 97.9°F | Resp 16 | Ht 67.0 in | Wt 222.6 lb

## 2023-06-15 DIAGNOSIS — E669 Obesity, unspecified: Secondary | ICD-10-CM

## 2023-06-15 DIAGNOSIS — E1122 Type 2 diabetes mellitus with diabetic chronic kidney disease: Secondary | ICD-10-CM | POA: Diagnosis not present

## 2023-06-15 DIAGNOSIS — R7989 Other specified abnormal findings of blood chemistry: Secondary | ICD-10-CM

## 2023-06-15 DIAGNOSIS — M542 Cervicalgia: Secondary | ICD-10-CM

## 2023-06-15 DIAGNOSIS — Z79899 Other long term (current) drug therapy: Secondary | ICD-10-CM

## 2023-06-15 DIAGNOSIS — E1169 Type 2 diabetes mellitus with other specified complication: Secondary | ICD-10-CM

## 2023-06-15 DIAGNOSIS — G8929 Other chronic pain: Secondary | ICD-10-CM

## 2023-06-15 DIAGNOSIS — N6489 Other specified disorders of breast: Secondary | ICD-10-CM

## 2023-06-15 DIAGNOSIS — E559 Vitamin D deficiency, unspecified: Secondary | ICD-10-CM

## 2023-06-15 DIAGNOSIS — I10 Essential (primary) hypertension: Secondary | ICD-10-CM

## 2023-06-15 DIAGNOSIS — E282 Polycystic ovarian syndrome: Secondary | ICD-10-CM

## 2023-06-15 DIAGNOSIS — N181 Chronic kidney disease, stage 1: Secondary | ICD-10-CM

## 2023-06-15 DIAGNOSIS — E785 Hyperlipidemia, unspecified: Secondary | ICD-10-CM

## 2023-06-15 NOTE — Patient Instructions (Signed)
 Dr. Maryan Char  Directions [address], [place], [place] [address]  2.1 mi [phone]

## 2023-06-16 LAB — COMPLETE METABOLIC PANEL WITH GFR
AG Ratio: 2 (calc) (ref 1.0–2.5)
ALT: 22 U/L (ref 6–29)
AST: 14 U/L (ref 10–35)
Albumin: 4.7 g/dL (ref 3.6–5.1)
Alkaline phosphatase (APISO): 77 U/L (ref 31–125)
BUN: 8 mg/dL (ref 7–25)
CO2: 26 mmol/L (ref 20–32)
Calcium: 9.9 mg/dL (ref 8.6–10.2)
Chloride: 103 mmol/L (ref 98–110)
Creat: 0.87 mg/dL (ref 0.50–0.99)
Globulin: 2.4 g/dL (ref 1.9–3.7)
Glucose, Bld: 81 mg/dL (ref 65–99)
Potassium: 4.2 mmol/L (ref 3.5–5.3)
Sodium: 139 mmol/L (ref 135–146)
Total Bilirubin: 0.6 mg/dL (ref 0.2–1.2)
Total Protein: 7.1 g/dL (ref 6.1–8.1)
eGFR: 83 mL/min/{1.73_m2} (ref >=60)

## 2023-06-16 LAB — MAGNESIUM: Magnesium: 1.9 mg/dL (ref 1.5–2.5)

## 2023-06-16 LAB — LIPID PANEL
Cholesterol: 151 mg/dL (ref <200)
HDL: 46 mg/dL — ABNORMAL LOW (ref >=50)
LDL Cholesterol (Calc): 86 mg/dL
Non-HDL Cholesterol (Calc): 105 mg/dL (ref <130)
Total CHOL/HDL Ratio: 3.3 (calc) (ref <5.0)
Triglycerides: 98 mg/dL (ref <150)

## 2023-06-16 LAB — CBC WITH DIFFERENTIAL/PLATELET
Absolute Lymphocytes: 2894 {cells}/uL (ref 850–3900)
Absolute Monocytes: 628 {cells}/uL (ref 200–950)
Basophils Absolute: 62 {cells}/uL (ref 0–200)
Basophils Relative: 0.6 %
Eosinophils Absolute: 258 {cells}/uL (ref 15–500)
Eosinophils Relative: 2.5 %
HCT: 43.1 % (ref 35.0–45.0)
Hemoglobin: 14.2 g/dL (ref 11.7–15.5)
MCH: 29.2 pg (ref 27.0–33.0)
MCHC: 32.9 g/dL (ref 32.0–36.0)
MCV: 88.7 fL (ref 80.0–100.0)
MPV: 9.6 fL (ref 7.5–12.5)
Monocytes Relative: 6.1 %
Neutro Abs: 6458 {cells}/uL (ref 1500–7800)
Neutrophils Relative %: 62.7 %
Platelets: 339 10*3/uL (ref 140–400)
RBC: 4.86 10*6/uL (ref 3.80–5.10)
RDW: 12.4 % (ref 11.0–15.0)
Total Lymphocyte: 28.1 %
WBC: 10.3 10*3/uL (ref 3.8–10.8)

## 2023-06-16 LAB — HEMOGLOBIN A1C W/OUT EAG: Hgb A1c MFr Bld: 5.6 %{Hb} (ref <5.7)

## 2023-07-17 ENCOUNTER — Other Ambulatory Visit: Payer: Self-pay | Admitting: Nurse Practitioner

## 2023-07-17 DIAGNOSIS — E1169 Type 2 diabetes mellitus with other specified complication: Secondary | ICD-10-CM

## 2023-07-18 MED ORDER — MOUNJARO 10 MG/0.5ML ~~LOC~~ SOAJ: 10.0000 mg | SUBCUTANEOUS | 0 refills | Status: DC

## 2023-08-08 ENCOUNTER — Other Ambulatory Visit: Payer: Self-pay | Admitting: Adult Health

## 2023-08-09 ENCOUNTER — Other Ambulatory Visit: Payer: Self-pay

## 2023-08-09 ENCOUNTER — Other Ambulatory Visit: Payer: Self-pay | Admitting: Family Medicine

## 2023-08-09 DIAGNOSIS — Z0000 Encounter for general adult medical examination without abnormal findings: Secondary | ICD-10-CM

## 2023-08-09 MED ORDER — METOPROLOL SUCCINATE ER 100 MG PO TB24: 100.0000 mg | ORAL_TABLET | Freq: Every day | ORAL | 0 refills | Status: DC

## 2023-09-04 ENCOUNTER — Encounter: Payer: Self-pay | Admitting: Physician Assistant

## 2023-09-04 ENCOUNTER — Ambulatory Visit: Attending: Physician Assistant | Admitting: Physician Assistant

## 2023-09-04 VITALS — BP 102/72 | HR 103 | Ht 66.0 in | Wt 226.0 lb

## 2023-09-04 DIAGNOSIS — R000 Tachycardia, unspecified: Secondary | ICD-10-CM

## 2023-09-04 NOTE — Progress Notes (Unsigned)
  Cardiology Office Note:  .   Date:  09/06/2023  ID:  Cindy Hooper, DOB 05-Feb-1976, MRN [id number] PCP: Raynelle Dick, NP (Inactive)  [facility] Providers Cardiologist:  Peter Swaziland, MD     History of Present Illness: .   Cindy Hooper is a 48 y.o. female with past medical history of tachycardia, PCOS, hypertension, hyperlipidemia and vitamin D deficiency.  Echocardiogram in January 2018 showed EF 60 to 65%, no regional wall motion abnormality, grade 2 DD.  Coronary calcium score obtained in January 2022 showed coronary calcium score of 0.  She was seen in the ED in February 2024 after vomiting of metoprolol and became tachycardic.  He was treated with IV Lopressor and recovered.  She was last seen by Joni Reining, NP on 08/05/2022.   Patient presents today for follow-up.  She denies any recent chest pain or shortness of breath.  She uses a recumbent bike 4-5 times a week without any issues.  She is able to ride the recumbent bike for 30 minutes at a time.  She has no lower extremity edema, orthopnea or PND.  She has no palpitation.  Blood pressure and cholesterol are very well-controlled.  I recommended continue on the current therapy.  Follow-up in 1 year.  ROS:   She denies chest pain, palpitations, dyspnea, pnd, orthopnea, n, v, dizziness, syncope, edema, weight gain, or early satiety. All other systems reviewed and are otherwise negative except as noted above.    Studies Reviewed: Marland Kitchen   EKG Interpretation Date/Time:  Monday September 04 2023 14:15:42 EDT Ventricular Rate:  103 PR Interval:    QRS Duration:  90 QT Interval:  358 QTC Calculation: 468 R Axis:   10  Text Interpretation: Sinus tachycardia no significant ST-T wave changes Confirmed by Azalee Course [id number]) on 09/06/2023 10:05:49 PM     Risk Assessment/Calculations:            Physical Exam:   VS:  BP 102/72 (BP Location: Left Arm, Patient Position: Sitting, Cuff Size: Large)   Pulse (!) 103   Ht 5\' 6"   (1.676 m)   Wt 226 lb (102.5 kg)   SpO2 97%   BMI 36.48 kg/m    Wt Readings from Last 3 Encounters:  09/04/23 226 lb (102.5 kg)  06/15/23 222 lb 9.6 oz (101 kg)  03/14/23 229 lb 6.4 oz (104.1 kg)    GEN: Well nourished, well developed in no acute distress NECK: No JVD; No carotid bruits CARDIAC: RRR, no murmurs, rubs, gallops RESPIRATORY:  Clear to auscultation without rales, wheezing or rhonchi  ABDOMEN: Soft, non-tender, non-distended EXTREMITIES:  No edema; No deformity   ASSESSMENT AND PLAN: .     Tachycardia Chronic tachycardia. Currently on metoprolol succinate 100 mg daily, but heart rate remains borderline high. No palpitations, dyspnea, or chest pain. Echocardiogram in 2018 showed EF 60-65% with no regional wall motion abnormalities. Coronary calcium score in 2022 was zero, indicating low risk for coronary artery disease.         Dispo: Follow-up in 1 year  Signed, Azalee Course, [place]

## 2023-09-04 NOTE — Patient Instructions (Signed)
 Medication Instructions:  NO CHANGES *If you need a refill on your cardiac medications before your next appointment, please call your pharmacy*  Lab Work: NO LABS If you have labs (blood work) drawn today and your tests are completely normal, you will receive your results only by: MyChart Message (if you have MyChart) OR A paper copy in the mail If you have any lab test that is abnormal or we need to change your treatment, we will call you to review the results.  Testing/Procedures: NO TESTING  Follow-Up: At [facility], you and your health needs are our priority.  As part of our continuing mission to provide you with exceptional heart care, our providers are all part of one team.  This team includes your primary Cardiologist (physician) and Advanced Practice Providers or APPs (Physician Assistants and Nurse Practitioners) who all work together to provide you with the care you need, when you need it.  Your next appointment:   1 year(s)  Provider:   Peter Swaziland, MD  Other Instructions   1st Floor: - Lobby - Registration  - Pharmacy  - Lab - Cafe  2nd Floor: - PV Lab - Diagnostic Testing (echo, CT, nuclear med)  3rd Floor: - Vacant  4th Floor: - TCTS (cardiothoracic surgery) - AFib Clinic - Structural Heart Clinic - Vascular Surgery  - Vascular Ultrasound  5th Floor: - HeartCare Cardiology (general and EP) - Clinical Pharmacy for coumadin, hypertension, lipid, weight-loss medications, and med management appointments    Valet parking services will be available as well.

## 2023-09-05 ENCOUNTER — Encounter: Payer: 59 | Admitting: Nurse Practitioner

## 2023-09-08 ENCOUNTER — Ambulatory Visit
Admission: RE | Admit: 2023-09-08 | Discharge: 2023-09-08 | Disposition: A | Discharge status: Home / Self Care | Payer: 59 | Source: Ambulatory Visit | Attending: Physician Assistant | Admitting: Physician Assistant

## 2023-09-08 DIAGNOSIS — Z0000 Encounter for general adult medical examination without abnormal findings: Secondary | ICD-10-CM

## 2023-09-13 ENCOUNTER — Encounter: Payer: Self-pay | Admitting: Physician Assistant

## 2023-09-14 ENCOUNTER — Encounter: Payer: 59 | Admitting: Nurse Practitioner

## 2023-09-14 ENCOUNTER — Encounter: Payer: Self-pay | Admitting: Physician Assistant

## 2023-09-14 ENCOUNTER — Ambulatory Visit: Admitting: Physician Assistant

## 2023-09-14 VITALS — BP 118/88 | HR 80 | Ht 66.0 in | Wt 225.4 lb

## 2023-09-14 DIAGNOSIS — Z7985 Long-term (current) use of injectable non-insulin antidiabetic drugs: Secondary | ICD-10-CM

## 2023-09-14 DIAGNOSIS — I10 Essential (primary) hypertension: Secondary | ICD-10-CM | POA: Diagnosis not present

## 2023-09-14 DIAGNOSIS — E785 Hyperlipidemia, unspecified: Secondary | ICD-10-CM

## 2023-09-14 DIAGNOSIS — E1169 Type 2 diabetes mellitus with other specified complication: Secondary | ICD-10-CM

## 2023-09-14 DIAGNOSIS — Z0000 Encounter for general adult medical examination without abnormal findings: Secondary | ICD-10-CM

## 2023-09-14 DIAGNOSIS — E282 Polycystic ovarian syndrome: Secondary | ICD-10-CM | POA: Insufficient documentation

## 2023-09-14 DIAGNOSIS — R000 Tachycardia, unspecified: Secondary | ICD-10-CM

## 2023-09-14 DIAGNOSIS — E669 Obesity, unspecified: Secondary | ICD-10-CM | POA: Insufficient documentation

## 2023-09-14 DIAGNOSIS — E119 Type 2 diabetes mellitus without complications: Secondary | ICD-10-CM

## 2023-09-14 MED ORDER — MOUNJARO 10 MG/0.5ML ~~LOC~~ SOAJ
10.0000 mg | SUBCUTANEOUS | 1 refills | Status: DC
Start: 2023-09-14 — End: 2024-02-23

## 2023-09-14 MED ORDER — METOPROLOL SUCCINATE ER 100 MG PO TB24
100.0000 mg | ORAL_TABLET | Freq: Every day | ORAL | 1 refills | Status: DC
Start: 2023-09-14 — End: 2024-02-23

## 2023-09-14 NOTE — Assessment & Plan Note (Addendum)
 Last A1c 06/15/23 5.6%  Managed with mounjaro 10 mg weekly  Discussed dietary modifications to enhance the effectiveness of Mounjaro, emphasizing the importance of protein intake and meal timing. Consideration of intermittent fasting as a potential short-term strategy.  On statin  Need uacr -- declines today.  Utd with optho F/u 6 mo

## 2023-09-14 NOTE — Assessment & Plan Note (Signed)
 Chronic, well controlled Managed with metoprolol 100 mg daily F/u 6 mo

## 2023-09-14 NOTE — Progress Notes (Signed)
 New patient visit   Patient: Cindy Hooper   DOB: 1975-11-10   48 y.o. Female  MRN: [id number] Visit Date: 09/14/2023  Today's healthcare provider: Alfredia Ferguson, PA-C   Cc. New patient establish care  Subjective    Cindy Hooper is a 48 y.o. female who presents today as a new patient to establish care.   Discussed the use of AI scribe software for clinical note transcription with the patient, who gave verbal consent to proceed.  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 She reports weight stagnation despite being on Mounjaro for weight loss for the past three months. She previously lost forty pounds on Ozempic but switched to [facility] for its dual hormone action. She reports no change in her weight since the switch and feels "stuck." She admits to not eating enough protein and usually only eats two meals a day. She also reports regular menstrual cycles.      Past Medical History:  Diagnosis Date   Benign essential HTN 05/2016   New diagnosis as of 05/2016   Diabetes mellitus without complication (HCC)    Hyperlipidemia    PCOS (polycystic ovarian syndrome) 2011   Vitamin D deficiency    Past Surgical History:  Procedure Laterality Date   None     Family Status  Relation Name Status   Mother Darl Pikes Deceased   Father Tammy Sours Deceased   MGM Rubin Payor Deceased   MGF Rosanne Ashing Deceased   PGM  Deceased   PGF  Deceased   Neg Hx  (Not Specified)  No partnership data on file   Family History  Problem Relation Age of Onset   CAD Mother 76       cardiac arrest while on vacation   Early death Mother    Heart disease Mother    Alcohol abuse Father 54   Early death Father    Heart disease Father    Liver cancer Maternal Grandmother    Colon cancer Maternal Grandmother    CAD Maternal Grandmother 28       pt got a PPM at 25, died at 64   Cancer - Colon Maternal Grandmother    Cancer Maternal  Grandmother    Diabetes Maternal Grandmother    Hypertension Maternal Grandmother    Stomach cancer Maternal Grandfather    Colon cancer Maternal Grandfather    CVA Maternal Grandfather 41   Cancer - Colon Maternal Grandfather    Cancer Maternal Grandfather    Stroke Maternal Grandfather    Breast cancer Neg Hx    Colon polyps Neg Hx    Esophageal cancer Neg Hx    Rectal cancer Neg Hx    Social History   Socioeconomic History   Marital status: Married    Spouse name: Not on file   Number of children: Not on file   Years of education: Not on file   Highest education level: Bachelor's degree (e.g., BA, AB, BS)  Occupational History   Occupation: Charity fundraiser for [employer]  Tobacco Use   Smoking status: Never   Smokeless tobacco: Never  Vaping Use   Vaping status: Never Used  Substance and Sexual Activity   Alcohol use: No   Drug use: No   Sexual activity: Yes    Birth control/protection: Condom  Other Topics Concern   Not on file  Social History Narrative   Pt lives with f[place] in [place].   Social Drivers of Corporate investment banker Strain: Low Risk  (09/07/2023)   Overall Financial Resource Strain (CARDIA)    Difficulty of Paying Living Expenses: Not hard at all  Food Insecurity: No Food Insecurity (09/07/2023)   Hunger Vital Sign    Worried About Running Out of Food in the Last Year: Never true    Ran Out of Food in the Last Year: Never true  Transportation Needs: No Transportation Needs (09/07/2023)   PRAPARE - Administrator, Civil Service (Medical): No    Lack of Transportation (Non-Medical): No  Physical Activity: Insufficiently Active (09/07/2023)   Exercise Vital Sign    Days of Exercise per Week: 4 days    Minutes of Exercise per Session: 30 min  Stress: No Stress Concern Present (09/07/2023)   [employer] of Occupational Health - Occupational Stress Questionnaire    Feeling of Stress : Only a little  Social Connections: Moderately Integrated (09/07/2023)    Social Connection and Isolation Panel [NHANES]    Frequency of Communication with Friends and Family: More than three times a week    Frequency of Social Gatherings with Friends and Family: Twice a week    Attends Religious Services: 1 to 4 times per year    Active Member of [employer] or Organizations: No    Attends Engineer, structural: Not on file    Marital Status: Married   Outpatient Medications Prior to Visit  Medication Sig   Cholecalciferol (VITAMIN D PO) Take 2,000 Units by mouth daily.   FIBER ADULT GUMMIES PO Take by mouth.   Multiple Vitamin (MULTIVITAMIN) tablet Take 1 tablet by mouth daily.   RHOFADE 1 % CREA Apply topically as needed.   rosuvastatin (CRESTOR) 5 MG tablet Take 1 tablet (5 mg total) by mouth daily.   [DISCONTINUED] metoprolol succinate (TOPROL-XL) 100 MG 24 hr tablet Take 1 tablet (100 mg total) by mouth daily. Take with or immediately following a meal.   [DISCONTINUED] tirzepatide (MOUNJARO) 10 MG/0.5ML Pen Inject 10 mg into the skin once a week.   No facility-administered medications prior to visit.   No Known Allergies  Immunization History  Administered Date(s) Administered   Fluzone Influenza virus vaccine,trivalent (IIV3), split virus 03/14/2023   PFIZER(Purple Top)SARS-COV-2 Vaccination 07/09/2019, 08/05/2019, 03/31/2020, 04/23/2021   Tdap 08/19/2018    Health Maintenance  Topic Date Due   OPHTHALMOLOGY EXAM  Never done   HIV Screening  Never done   Hepatitis C Screening  Never done   Pneumococcal Vaccine 72-25 Years old (1 of 2 - PCV) Never done   Diabetic kidney evaluation - Urine ACR  09/05/2023   COVID-19 Vaccine (6 - Pfizer risk 2024-25 season) 10/08/2023   HEMOGLOBIN A1C  12/13/2023   INFLUENZA VACCINE  01/05/2024   FOOT EXAM  03/13/2024   Diabetic kidney evaluation - eGFR measurement  06/14/2024   MAMMOGRAM  09/07/2024   Cervical Cancer Screening (HPV/Pap Cotest)  12/10/2026   DTaP/Tdap/Td (2 - Td or Tdap) 08/18/2028    Colonoscopy  11/15/2032   HPV VACCINES  Aged Out   Meningococcal B Vaccine  Aged Out    Patient Care Team: Alfredia Ferguson, PA-C as PCP - General (Physician Assistant) [place], Peter M, MD as PCP - Cardiology (Cardiology)  Review of Systems  Constitutional:  Negative for fatigue and fever.  Respiratory:  Negative for cough and shortness of breath.   Cardiovascular:  Negative for chest pain and leg swelling.  Gastrointestinal:  Negative for abdominal pain.  Neurological:  Negative for dizziness and headaches.        Objective    BP 118/88   Pulse 80   Ht 5\' 6"  (1.676 m)   Wt 225 lb 6.4 oz (102.2 kg)   LMP 09/14/2023 (Exact Date)   BMI 36.38 kg/m     Physical Exam Constitutional:      General: She is awake.     Appearance: She is well-developed. She is not ill-appearing.  HENT:     Head: Normocephalic.     Right Ear: Tympanic membrane normal.     Left Ear: Tympanic membrane normal.     Nose: Nose normal. No congestion or rhinorrhea.     Mouth/Throat:     Pharynx: No oropharyngeal exudate or posterior oropharyngeal erythema.  Eyes:     Conjunctiva/sclera: Conjunctivae normal.     Pupils: Pupils are equal, round, and reactive to light.  Neck:     Thyroid: No thyroid mass or thyromegaly.  Cardiovascular:     Rate and Rhythm: Normal rate and regular rhythm.     Heart sounds: Normal heart sounds.  Pulmonary:     Effort: Pulmonary effort is normal.     Breath sounds: Normal breath sounds.  Abdominal:     Palpations: Abdomen is soft.     Tenderness: There is no abdominal tenderness.  Musculoskeletal:     Right lower leg: No swelling. No edema.     Left lower leg: No swelling. No edema.  Lymphadenopathy:     Cervical: No cervical adenopathy.  Skin:    General: Skin is warm.  Neurological:     Mental Status: She is alert and oriented to person, place, and time.  Psychiatric:        Attention and Perception: Attention normal.        Mood and Affect: Mood normal.         Speech: Speech normal.        Behavior: Behavior normal. Behavior is cooperative.    Depression Screen    09/14/2023    3:14 PM  PHQ 2/9 Scores  PHQ - 2 Score 0   No results found for any visits on 09/14/23.  Assessment & Plan     Annual physical exam General recommendations: --balanced diet high in fiber and protein, low in sugars, carbs, fats. --physical activity/exercise 20-30 minutes 3-5 times a week    Up to date on cancer screenings and has a scheduled eye appointment with Dr. Carlynn Purl in June. Compliant with dental visits twice a year. A urine test for kidney screening is due but can be deferred for six months as previous results were normal.  Type 2 diabetes mellitus with obesity [facility]) Assessment & Plan: Last A1c 06/15/23 5.6%  Managed with mounjaro 10 mg weekly  Discussed dietary modifications to enhance the effectiveness of Mounjaro, emphasizing the importance of protein intake and meal timing. Consideration of intermittent fasting as a potential short-term strategy.   On statin  Need uacr -- declines today.  Utd with optho F/u 6 mo  Orders: -     Mounjaro; Inject 10 mg into the skin once a week.  Dispense: 6 mL; Refill: 1  Sinus tachycardia Assessment & Plan: Controlled, manages with metop 100 mg  Follows with cardiology annually   Orders: -     Metoprolol Succinate ER; Take 1 tablet (100 mg total) by mouth daily. Take with or immediately following a meal.  Dispense: 90 tablet; Refill: 1  Essential hypertension Assessment & Plan: Chronic, well controlled Managed with metoprolol 100 mg daily F/u 6 mo   Hyperlipidemia associated with type 2 diabetes mellitus (HCC) Assessment & Plan: 1/25 LDL 86, manages with crestor 5 mg  Coronary calcium score 2022 was 0.   Return in about 6 months (around 03/15/2024) for chronic conditions.     Alfredia Ferguson, PA-C  [facility] [phone] (phone) [phone]  (fax)  [facility]

## 2023-09-14 NOTE — Assessment & Plan Note (Signed)
 1/25 LDL 86, manages with crestor 5 mg  Coronary calcium score 2022 was 0.

## 2023-09-14 NOTE — Assessment & Plan Note (Signed)
 Controlled, manages with metop 100 mg  Follows with cardiology annually

## 2023-09-21 ENCOUNTER — Ambulatory Visit: Payer: 59 | Admitting: Adult Health

## 2023-12-11 ENCOUNTER — Encounter: Payer: Self-pay | Admitting: Physician Assistant

## 2023-12-11 ENCOUNTER — Other Ambulatory Visit: Payer: Self-pay | Admitting: *Deleted

## 2023-12-11 DIAGNOSIS — E1169 Type 2 diabetes mellitus with other specified complication: Secondary | ICD-10-CM

## 2023-12-11 MED ORDER — ROSUVASTATIN CALCIUM 5 MG PO TABS: 5.0000 mg | ORAL_TABLET | Freq: Every day | ORAL | 3 refills | Status: DC

## 2024-02-08 ENCOUNTER — Encounter: Payer: Self-pay | Admitting: Physician Assistant

## 2024-02-23 ENCOUNTER — Other Ambulatory Visit: Payer: Self-pay | Admitting: Family Medicine

## 2024-02-23 DIAGNOSIS — E1169 Type 2 diabetes mellitus with other specified complication: Secondary | ICD-10-CM

## 2024-02-23 DIAGNOSIS — R000 Tachycardia, unspecified: Secondary | ICD-10-CM

## 2024-02-23 MED ORDER — METOPROLOL SUCCINATE ER 100 MG PO TB24: 100.0000 mg | ORAL_TABLET | Freq: Every day | ORAL | 1 refills | Status: AC

## 2024-02-23 MED ORDER — MOUNJARO 10 MG/0.5ML ~~LOC~~ SOAJ: 10.0000 mg | SUBCUTANEOUS | 1 refills | Status: DC

## 2024-02-23 NOTE — Telephone Encounter (Unsigned)
 Copied from CRM [id number]. Topic: Clinical - Medication Refill >> Feb 23, 2024  9:55 AM Tanazia G wrote: Medication: tirzepatide  (MOUNJARO ) 10 MG/0.5ML Pen  metoprolol  succinate (TOPROL -XL) 100 MG 24 hr tablet   Has the patient contacted their pharmacy? Yes (Agent: If no, request that the patient contact the pharmacy for the refill. If patient does not wish to contact the pharmacy document the reason why and proceed with request.) (Agent: If yes, when and what did the pharmacy advise?)  This is the patient's preferred pharmacy:  [facility] [address] [place], [place] - [address] [place] [address] Phone: [phone] Fax: [phone]  Is this the correct pharmacy for this prescription? Yes If no, delete pharmacy and type the correct one.   Has the prescription been filled recently? Yes  Is the patient out of the medication? No  Has the patient been seen for an appointment in the last year OR does the patient have an upcoming appointment? Yes  Can we respond through MyChart? No, [phone] patient requesting phone call.  Agent: Please be advised that Rx refills may take up to 3 business days. We ask that you follow-up with your pharmacy.

## 2024-03-15 ENCOUNTER — Ambulatory Visit: Admitting: Physician Assistant

## 2024-04-23 DIAGNOSIS — E559 Vitamin D deficiency, unspecified: Secondary | ICD-10-CM | POA: Insufficient documentation

## 2024-04-24 ENCOUNTER — Other Ambulatory Visit: Payer: Self-pay | Admitting: Obstetrics and Gynecology

## 2024-04-29 ENCOUNTER — Other Ambulatory Visit: Payer: Self-pay | Admitting: Medical Genetics

## 2024-04-30 ENCOUNTER — Encounter: Payer: Self-pay | Admitting: Family Medicine

## 2024-04-30 ENCOUNTER — Ambulatory Visit: Admitting: Family Medicine

## 2024-04-30 VITALS — BP 119/65 | HR 84 | Temp 97.4°F | Ht 66.0 in | Wt 224.0 lb

## 2024-04-30 DIAGNOSIS — Z6836 Body mass index (BMI) 36.0-36.9, adult: Secondary | ICD-10-CM

## 2024-04-30 DIAGNOSIS — E1169 Type 2 diabetes mellitus with other specified complication: Secondary | ICD-10-CM

## 2024-04-30 DIAGNOSIS — E669 Obesity, unspecified: Secondary | ICD-10-CM

## 2024-04-30 DIAGNOSIS — Z006 Encounter for examination for normal comparison and control in clinical research program: Secondary | ICD-10-CM

## 2024-04-30 DIAGNOSIS — Z0000 Encounter for general adult medical examination without abnormal findings: Secondary | ICD-10-CM

## 2024-04-30 DIAGNOSIS — E785 Hyperlipidemia, unspecified: Secondary | ICD-10-CM

## 2024-04-30 DIAGNOSIS — R5383 Other fatigue: Secondary | ICD-10-CM

## 2024-04-30 DIAGNOSIS — Z23 Encounter for immunization: Secondary | ICD-10-CM

## 2024-04-30 DIAGNOSIS — Z7985 Long-term (current) use of injectable non-insulin antidiabetic drugs: Secondary | ICD-10-CM

## 2024-04-30 DIAGNOSIS — E559 Vitamin D deficiency, unspecified: Secondary | ICD-10-CM

## 2024-04-30 DIAGNOSIS — I10 Essential (primary) hypertension: Secondary | ICD-10-CM

## 2024-04-30 DIAGNOSIS — E282 Polycystic ovarian syndrome: Secondary | ICD-10-CM

## 2024-04-30 LAB — IBC + FERRITIN
Ferritin: 28.9 ng/mL (ref 10.0–291.0)
Iron: 84 ug/dL (ref 42–145)
Saturation Ratios: 25.4 % (ref 20.0–50.0)
TIBC: 330.4 ug/dL (ref 250.0–450.0)
Transferrin: 236 mg/dL (ref 212.0–360.0)

## 2024-04-30 LAB — MICROALBUMIN / CREATININE URINE RATIO
Creatinine,U: 131.3 mg/dL
Microalb Creat Ratio: UNDETERMINED mg/g (ref 0.0–30.0)
Microalb, Ur: <0.7 mg/dL

## 2024-04-30 LAB — B12 AND FOLATE PANEL
Folate: 9.9 ng/mL (ref >5.9)
Vitamin B-12: 157 pg/mL — ABNORMAL LOW (ref 211–911)

## 2024-04-30 LAB — COMPREHENSIVE METABOLIC PANEL WITH GFR
ALT: 26 U/L (ref 0–35)
AST: 16 U/L (ref 0–37)
Albumin: 4.4 g/dL (ref 3.5–5.2)
Alkaline Phosphatase: 62 U/L (ref 39–117)
BUN: 9 mg/dL (ref 6–23)
CO2: 30 meq/L (ref 19–32)
Calcium: 9.2 mg/dL (ref 8.4–10.5)
Chloride: 105 meq/L (ref 96–112)
Creatinine, Ser: 0.79 mg/dL (ref 0.40–1.20)
GFR: 89.07 mL/min (ref >60.00)
Glucose, Bld: 83 mg/dL (ref 70–99)
Potassium: 4.3 meq/L (ref 3.5–5.1)
Sodium: 138 meq/L (ref 135–145)
Total Bilirubin: 0.5 mg/dL (ref 0.2–1.2)
Total Protein: 6.5 g/dL (ref 6.0–8.3)

## 2024-04-30 LAB — CORTISOL: Cortisol, Plasma: 5.7 ug/dL

## 2024-04-30 LAB — TSH: TSH: 2.12 u[IU]/mL (ref 0.35–5.50)

## 2024-04-30 LAB — VITAMIN D 25 HYDROXY (VIT D DEFICIENCY, FRACTURES): VITD: 27.7 ng/mL — ABNORMAL LOW (ref 30.00–100.00)

## 2024-04-30 LAB — HEMOGLOBIN A1C: Hgb A1c MFr Bld: 5.4 % (ref 4.6–6.5)

## 2024-04-30 MED ORDER — ROSUVASTATIN CALCIUM 5 MG PO TABS: 5.0000 mg | ORAL_TABLET | Freq: Every day | ORAL | 3 refills | Status: AC

## 2024-04-30 MED ORDER — TIRZEPATIDE 12.5 MG/0.5ML ~~LOC~~ SOAJ: 12.5000 mg | SUBCUTANEOUS | 1 refills | Status: DC

## 2024-04-30 NOTE — Progress Notes (Signed)
 New Patient Office Visit   Subjective     Patient ID: Cindy Hooper, female   DOB: 08/23/1975  Age: 48 y.o. MRN: [id number]   CC:  Chief Complaint  Patient presents with   Establish Care    Scheduled to have surgery 06/13/24, wanted to make her aware No other issues      HPI Cindy Hooper presents to establish/transfer care. She lives with her husband and works as a engineer, civil (consulting) for [employer].   Discussed the use of AI scribe software for clinical note transcription with the patient, who gave verbal consent to proceed.  History of Present Illness Cindy Hooper is a 48 year old female who presents to transfer care.   During a routine gynecological exam last month, a large abdominal mass was discovered. An ultrasound confirmed a 17 cm mass, which she was unaware of as she has not experienced any symptoms such as pain or gastrointestinal issues. The mass is scheduled for removal on January 8th. It is believed to originate from the right side, but its exact origin is unclear due to size.  She has a history of hypertension, managed with metoprolol  100 mg daily, and her blood pressure is well-controlled. She is also on rosuvastatin  5 mg daily for hyperlipidemia with no side effects. For diabetes management, she takes Mounjaro  10 mg, and her last A1c was noted to be good in January. She monitors her blood sugar and reports it is stable. She requests a refill for Mounjaro , but would like to increase dose due to weight plateau. She has a history of polycystic ovary syndrome (PCOS) and notes that her current medication regimen is helping manage this condition.  She takes vitamin D  2000 IU daily, although it has been a while since her levels were last checked. She experiences some fatigue and low energy, which she attributes to possible premenopausal symptoms. She has not had her B12 or iron levels checked recently. She would like a cortisol level with labs.   In terms of lifestyle, she  maintains a good diet and exercises by walking three to four times a week. 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      Hypertension, tachycardia: - Follows with Dr. Jordan annually  - Medications: Metoprolol  succinate 100 mg daily. - Compliance: good - Checking BP at home: controlled - Denies any SOB, recurrent headaches, CP, vision changes, LE edema, dizziness, palpitations, or medication side effects.   Hyperlipidemia: - medications: Rosuvastatin  5 mg daily.  - compliance: good - medication SEs: no The 10-year ASCVD risk score (Arnett DK, et al., 2019) is: 1.8%   Values used to calculate the score:     Age: 31 years     Clincally relevant sex: Female     Is Non-Hispanic African American: No     Diabetic: Yes     Tobacco smoker: No     Systolic Blood Pressure: 119 mmHg     Is BP treated: Yes     HDL Cholesterol: 46 mg/dL     Total Cholesterol: 151 mg/dL   Diabetes, obesity: - Checking glucose at home: always good - Medications: Mounjaro  10 mg weekly. - Compliance: good - Diet: healthy - Exercise: regular walking, biking - Eye exam:  - Foot exam: due - Microalbumin: done today - Denies symptoms of hypoglycemia, polyuria, polydipsia, numbness extremities, foot ulcers/trauma, wounds that are not  healing, medication side effects  Lab Results  Component Value Date   HGBA1C 5.6 06/15/2023    Wt Readings from Last 3 Encounters:  04/30/24 224 lb (101.6 kg)  09/14/23 225 lb 6.4 oz (102.2 kg)  09/04/23 226 lb (102.5 kg)    PCOS: - Followed by Gynecology. - Management: Mounjaro  - Compliance: good - Symptoms: GYN found 17 cm cyst in October, asymptomatic  - Patient is scheduled for ovarian cyst excision in January    Vitamin D  deficiency: - management: 2,000 daily - symptoms: none      Outpatient Medications Prior to Visit  Medication Sig   Cholecalciferol (VITAMIN D  PO) Take 2,000  Units by mouth daily.   FIBER ADULT GUMMIES PO Take by mouth.   metoprolol  succinate (TOPROL -XL) 100 MG 24 hr tablet Take 1 tablet (100 mg total) by mouth daily. Take with or immediately following a meal.   Multiple Vitamin (MULTIVITAMIN) tablet Take 1 tablet by mouth daily.   [DISCONTINUED] rosuvastatin  (CRESTOR ) 5 MG tablet Take 1 tablet (5 mg total) by mouth daily.   [DISCONTINUED] tirzepatide  (MOUNJARO ) 10 MG/0.5ML Pen Inject 10 mg into the skin once a week.   [DISCONTINUED] RHOFADE 1 % CREA Apply topically as needed.   No facility-administered medications prior to visit.   Past Medical History:  Diagnosis Date   Benign essential HTN 05/2016   New diagnosis as of 05/2016   Diabetes mellitus without complication (HCC)    Hyperlipidemia    PCOS (polycystic ovarian syndrome) 2011   Vitamin D  deficiency     Past Surgical History:  Procedure Laterality Date   None       Family History  Problem Relation Age of Onset   CAD Mother 76       cardiac arrest while on vacation   Early death Mother    Heart disease Mother    Alcohol abuse Father 80   Early death Father    Heart disease Father    Liver cancer Maternal Grandmother    Colon cancer Maternal Grandmother    CAD Maternal Grandmother 68       pt got a PPM at 38, died at 62   Cancer - Colon Maternal Grandmother    Cancer Maternal Grandmother    Diabetes Maternal Grandmother    Hypertension Maternal Grandmother    Stomach cancer Maternal Grandfather    Colon cancer Maternal Grandfather    CVA Maternal Grandfather 7   Cancer - Colon Maternal Grandfather    Cancer Maternal Grandfather    Stroke Maternal Grandfather    Breast cancer Neg Hx    Colon polyps Neg Hx    Esophageal cancer Neg Hx    Rectal cancer Neg Hx     Social History   Socioeconomic History   Marital status: Married    Spouse name: Not on file   Number of children: Not on file   Years of education: Not on file   Highest education level:  Bachelor's degree (e.g., BA, AB, BS)  Occupational History   Occupation: CHARITY FUNDRAISER for [employer]  Tobacco Use   Smoking status: Never   Smokeless tobacco: Never  Vaping Use   Vaping status: Never Used  Substance and Sexual Activity   Alcohol use: No   Drug use: No   Sexual activity: Yes    Birth control/protection: Condom  Other Topics Concern   Not on file  Social History Narrative   Pt lives with fiance in [place].   Social Drivers  of Health   Financial Resource Strain: Low Risk  (04/23/2024)   Overall Financial Resource Strain (CARDIA)    Difficulty of Paying Living Expenses: Not hard at all  Food Insecurity: No Food Insecurity (04/23/2024)   Hunger Vital Sign    Worried About Running Out of Food in the Last Year: Never true    Ran Out of Food in the Last Year: Never true  Transportation Needs: No Transportation Needs (04/23/2024)   PRAPARE - Administrator, Civil Service (Medical): No    Lack of Transportation (Non-Medical): No  Physical Activity: Insufficiently Active (04/23/2024)   Exercise Vital Sign    Days of Exercise per Week: 4 days    Minutes of Exercise per Session: 30 min  Stress: No Stress Concern Present (04/23/2024)   [employer] of Occupational Health - Occupational Stress Questionnaire    Feeling of Stress: Only a little  Social Connections: Unknown (04/23/2024)   Social Connection and Isolation Panel    Frequency of Communication with Friends and Family: Patient declined    Frequency of Social Gatherings with Friends and Family: Patient declined    Attends Religious Services: Patient declined    Database Administrator or Organizations: Patient declined    Attends Engineer, Structural: Not on file    Marital Status: Married       ROS All review of systems negative except what is listed in the HPI    Objective     BP 119/65 (Patient Position: Sitting, Cuff Size: Normal)   Pulse 84   Temp (!) 97.4 F (36.3 C) (Oral)   Ht 5' 6  (1.676 m)   Wt 224 lb (101.6 kg)   SpO2 100%   BMI 36.15 kg/m   Physical Exam Vitals reviewed.  Constitutional:      Appearance: Normal appearance. She is obese.  Cardiovascular:     Rate and Rhythm: Normal rate and regular rhythm.     Heart sounds: Normal heart sounds.  Pulmonary:     Effort: Pulmonary effort is normal.     Breath sounds: Normal breath sounds.  Abdominal:     Palpations: There is mass.     Tenderness: There is no abdominal tenderness. There is no guarding.  Skin:    General: Skin is warm and dry.  Neurological:     Mental Status: She is alert and oriented to person, place, and time.  Psychiatric:        Mood and Affect: Mood normal.        Behavior: Behavior normal.        Thought Content: Thought content normal.        Judgment: Judgment normal.        Assessment & Plan:     Problem List Items Addressed This Visit       Active Problems   Hyperlipidemia associated with type 2 diabetes mellitus (HCC)   Medication management: rosuvastatin  5 mg daily Lifestyle factors for lowering cholesterol include: Diet therapy - heart-healthy diet rich in fruits, veggies, fiber-rich whole grains, lean meats, chicken, fish (at least twice a week), fat-free or 1% dairy products; foods low in saturated/trans fats, cholesterol, sodium, and sugar. Mediterranean diet has shown to be very heart healthy. Regular exercise - recommend at least 30 minutes a day, 5 times per week Weight management         Relevant Medications   tirzepatide  (MOUNJARO ) 12.5 MG/0.5ML Pen   rosuvastatin  (CRESTOR ) 5 MG tablet  Essential hypertension   Blood pressure is at goal for age and co-morbidities.   Recommendations: continue metoprolol  100 mg daily - BP goal <130/80 - monitor and log blood pressures at home - check around the same time each day in a relaxed setting - Limit salt to <2000 mg/day - Follow DASH eating plan (heart healthy diet) - limit alcohol to 2 standard drinks per  day for men and 1 per day for women - avoid tobacco products - get at least 2 hours of regular aerobic exercise weekly Patient aware of signs/symptoms requiring further/urgent evaluation. Labs updated today.       Relevant Medications   rosuvastatin  (CRESTOR ) 5 MG tablet   Other Relevant Orders   Comprehensive metabolic panel with GFR   TSH   Type 2 diabetes mellitus in patient with obesity (HCC)   Well-controlled with Mounjaro  10 mg. Last A1c was good in January. No reported side effects. - Rechecked A1c. - Refilled Mounjaro  prescription.      Relevant Medications   tirzepatide  (MOUNJARO ) 12.5 MG/0.5ML Pen   rosuvastatin  (CRESTOR ) 5 MG tablet   Other Relevant Orders   Hemoglobin A1c   Comprehensive metabolic panel with GFR   Microalbumin / creatinine urine ratio   PCOS (polycystic ovarian syndrome)   Managed by GYN. Planning for surgery in January.       Relevant Medications   tirzepatide  (MOUNJARO ) 12.5 MG/0.5ML Pen   Vitamin D  deficiency   Current dose is 2000 IU daily. Vitamin D  levels not checked recently. - Rechecked vitamin D  levels.      Relevant Orders   Vitamin D  (25 hydroxy)   Other Visit Diagnoses       Encounter for medical examination to establish care    -  Primary     Fatigue, unspecified type       Relevant Orders   Comprehensive metabolic panel with GFR   TSH   Cortisol   B12 and Folate Panel   IBC + Ferritin     Examination of participant in clinical trial       Relevant Orders   GeneConnect Molecular Screen     Immunization due       Relevant Orders   Flu vaccine trivalent PF, 6mos and older(Flulaval,Afluria,Fluarix,Fluzone ) (Completed)          Return for physical due in April .  Waddell KATHEE Mon, NP  I,Emily Lagle,acting as a scribe for Waddell KATHEE Mon, NP.,have documented all relevant documentation on the behalf of Waddell KATHEE Mon, NP.  I, Waddell KATHEE Mon, NP, have reviewed all documentation for this visit. The documentation on  04/30/2024 for the exam, diagnosis, procedures, and orders are all accurate and complete.

## 2024-04-30 NOTE — Assessment & Plan Note (Signed)
 Managed by GYN. Planning for surgery in January.

## 2024-04-30 NOTE — Assessment & Plan Note (Signed)
 Blood pressure is at goal for age and co-morbidities.   Recommendations: continue metoprolol  100 mg daily - BP goal <130/80 - monitor and log blood pressures at home - check around the same time each day in a relaxed setting - Limit salt to <2000 mg/day - Follow DASH eating plan (heart healthy diet) - limit alcohol to 2 standard drinks per day for men and 1 per day for women - avoid tobacco products - get at least 2 hours of regular aerobic exercise weekly Patient aware of signs/symptoms requiring further/urgent evaluation. Labs updated today.

## 2024-04-30 NOTE — Assessment & Plan Note (Signed)
 Medication management: rosuvastatin 5 mg daily Lifestyle factors for lowering cholesterol include: Diet therapy - heart-healthy diet rich in fruits, veggies, fiber-rich whole grains, lean meats, chicken, fish (at least twice a week), fat-free or 1% dairy products; foods low in saturated/trans fats, cholesterol, sodium, and sugar. Mediterranean diet has shown to be very heart healthy. Regular exercise - recommend at least 30 minutes a day, 5 times per week Weight management

## 2024-04-30 NOTE — Assessment & Plan Note (Signed)
 Well-controlled with Mounjaro  10 mg. Last A1c was good in January. No reported side effects. - Rechecked A1c. - Refilled Mounjaro  prescription.

## 2024-04-30 NOTE — Assessment & Plan Note (Signed)
 Current dose is 2000 IU daily. Vitamin D  levels not checked recently. - Rechecked vitamin D  levels.

## 2024-05-01 ENCOUNTER — Ambulatory Visit: Payer: Self-pay | Admitting: Family Medicine

## 2024-05-08 ENCOUNTER — Telehealth: Payer: Self-pay

## 2024-05-08 NOTE — Telephone Encounter (Signed)
   Pre-operative Risk Assessment    Patient Name: Cindy Hooper  DOB: [birth year] MRN: [id number]   Date of last office visit: 09/04/23 Scot Ford Date of next office visit: None   Request for Surgical Clearance    Procedure:  mini-laparotomy with ovarian cystectomy, possible oophorectomy  Date of Surgery:  Clearance 06/13/24                                Surgeon:  Dr Nena Rivard Surgeon's Group or Practice Name:  [facility] Phone number:  [phone] Fax number:  [phone]   Type of Clearance Requested:   - Medical    Type of Anesthesia:  General    Additional requests/questions:    Signed, Latiya Navia   05/08/2024, 5:06 PM

## 2024-05-09 ENCOUNTER — Telehealth (HOSPITAL_BASED_OUTPATIENT_CLINIC_OR_DEPARTMENT_OTHER): Payer: Self-pay | Admitting: *Deleted

## 2024-05-09 NOTE — Telephone Encounter (Signed)
 S/w the pt and she has been scheduled tele preop appt 05/22/24. Med rec and consent are done.

## 2024-05-09 NOTE — Telephone Encounter (Signed)
 S/w the pt and she has been scheduled tele preop appt 05/22/24. Med rec and consent are done.      Patient Consent for Virtual Visit        SELENIA MIHOK has provided verbal consent on 05/09/2024 for a virtual visit (video or telephone).   CONSENT FOR VIRTUAL VISIT FOR:  Clotilda FORBES Hastings  By participating in this virtual visit I agree to the following:  I hereby voluntarily request, consent and authorize [facility] and its employed or contracted physicians, physician assistants, nurse practitioners or other licensed health care professionals (the Practitioner), to provide me with telemedicine health care services (the "Services) as deemed necessary by the treating Practitioner. I acknowledge and consent to receive the Services by the Practitioner via telemedicine. I understand that the telemedicine visit will involve communicating with the Practitioner through live audiovisual communication technology and the disclosure of certain medical information by electronic transmission. I acknowledge that I have been given the opportunity to request an in-person assessment or other available alternative prior to the telemedicine visit and am voluntarily participating in the telemedicine visit.  I understand that I have the right to withhold or withdraw my consent to the use of telemedicine in the course of my care at any time, without affecting my right to future care or treatment, and that the Practitioner or I may terminate the telemedicine visit at any time. I understand that I have the right to inspect all information obtained and/or recorded in the course of the telemedicine visit and may receive copies of available information for a reasonable fee.  I understand that some of the potential risks of receiving the Services via telemedicine include:  Delay or interruption in medical evaluation due to technological equipment failure or disruption; Information transmitted may not be sufficient  (e.g. poor resolution of images) to allow for appropriate medical decision making by the Practitioner; and/or  In rare instances, security protocols could fail, causing a breach of personal health information.  Furthermore, I acknowledge that it is my responsibility to provide information about my medical history, conditions and care that is complete and accurate to the best of my ability. I acknowledge that Practitioner's advice, recommendations, and/or decision may be based on factors not within their control, such as incomplete or inaccurate data provided by me or distortions of diagnostic images or specimens that may result from electronic transmissions. I understand that the practice of medicine is not an exact science and that Practitioner makes no warranties or guarantees regarding treatment outcomes. I acknowledge that a copy of this consent can be made available to me via my patient portal [facility] MyChart), or I can request a printed copy by calling the office of [facility].    I understand that my insurance will be billed for this visit.   I have read or had this consent read to me. I understand the contents of this consent, which adequately explains the benefits and risks of the Services being provided via telemedicine.  I have been provided ample opportunity to ask questions regarding this consent and the Services and have had my questions answered to my satisfaction. I give my informed consent for the services to be provided through the use of telemedicine in my medical care

## 2024-05-09 NOTE — Telephone Encounter (Signed)
   Name: Cindy Hooper  DOB: May 29, 1976  MRN: [id number]  Primary Cardiologist: Peter Jordan, MD   Preoperative team, please contact this patient and set up a phone call appointment for further preoperative risk assessment. Please obtain consent and complete medication review. Thank you for your help.  I confirm that guidance regarding antiplatelet and oral anticoagulation therapy has been completed and, if necessary, noted below.  Patient is not on anticoagulation or antiplatelet per review of current medical record in Epic.    I also confirmed the patient resides in the state of [place] . As per [facility] Medical Board telemedicine laws, the patient must reside in the state in which the provider is licensed.   Lamarr Satterfield, NP 05/09/2024, 7:57 AM [facility]

## 2024-05-09 NOTE — Telephone Encounter (Signed)
 Caller Cindy Hooper) is following-up on the status of patient's clearance.

## 2024-05-11 LAB — GENECONNECT MOLECULAR SCREEN: Genetic Analysis Overall Interpretation: NEGATIVE

## 2024-05-15 NOTE — Telephone Encounter (Signed)
 Will update the requesting office the pt has a tele preop appt 05/22/24 for preop clearance at that time to be assessed.   Once the pt has been cleared the provider will fax a notes providing if the pt has been cleared.       Note  [facility] calling for update. Asked for consent to be sent to [phone] once confirmed.            05/15/24 10:56 AM [facility] contacted Kirby, Jada

## 2024-05-15 NOTE — Telephone Encounter (Signed)
 [facility] calling for update. Asked for consent to be sent to [phone] once confirmed.

## 2024-05-22 ENCOUNTER — Ambulatory Visit

## 2024-05-22 DIAGNOSIS — Z01810 Encounter for preprocedural cardiovascular examination: Secondary | ICD-10-CM

## 2024-05-22 NOTE — Progress Notes (Signed)
 Virtual Visit via Telephone Note   Because of Cindy Hooper co-morbid illnesses, she is at least at moderate risk for complications without adequate follow up.  This format is felt to be most appropriate for this patient at this time.  Due to technical limitations with video connection (technology), today's appointment will be conducted as an audio only telehealth visit, and Cindy Hooper verbally agreed to proceed in this manner.   All issues noted in this document were discussed and addressed.  No physical exam could be performed with this format.  Evaluation Performed:  Preoperative cardiovascular risk assessment _____________   Date:  05/22/2024   Patient ID:  Cindy Hooper, DOB [birth year], MRN [id number] Patient Location:  Home Provider location:   Office  Primary Care Provider:  Almarie Waddell NOVAK, NP Primary Cardiologist:  Peter Jordan, MD  Chief Complaint / Patient Profile   48 y.o. y/o female with a h/o SVT, HTN, HLD who is pending mini-laparotomy with ovarian cystectomy, possible oophorectomy and presents today for telephonic preoperative cardiovascular risk assessment.  History of Present Illness    Cindy Hooper is a 48 y.o. female who presents via audio/video conferencing for a telehealth visit today.  Pt was last seen in cardiology clinic on 09/04/23 by Scot Ford, PA-C.  At that time Cindy Hooper was doing well.  The patient is now pending procedure as outlined above. Since her last visit, she has been doing really well without any palpitations.  Blood pressure has been stable.  She walks her dog every day and does the bike for 30 minutes each day.  She remains active and asymptomatic at this time.  She is not currently on aspirin or any blood thinners.  Past Medical History    Past Medical History:  Diagnosis Date   Benign essential HTN 05/2016   New diagnosis as of 05/2016   Diabetes mellitus without complication (HCC)    Hyperlipidemia    PCOS (polycystic  ovarian syndrome) 2011   Vitamin D  deficiency    Past Surgical History:  Procedure Laterality Date   None      Allergies  Allergies[1]  Home Medications    Prior to Admission medications  Medication Sig Start Date End Date Taking? Authorizing Provider  Cholecalciferol (VITAMIN D  PO) Take 2,000 Units by mouth daily.    [provider]  FIBER ADULT GUMMIES PO Take by mouth.    [provider]  metoprolol  succinate (TOPROL -XL) 100 MG 24 hr tablet Take 1 tablet (100 mg total) by mouth daily. Take with or immediately following a meal. 02/23/24   Webb, Padonda B, FNP  Multiple Vitamin (MULTIVITAMIN) tablet Take 1 tablet by mouth daily.    [provider]  rosuvastatin  (CRESTOR ) 5 MG tablet Take 1 tablet (5 mg total) by mouth daily. 04/30/24 04/30/25  Almarie Waddell NOVAK, NP  tirzepatide  (MOUNJARO ) 12.5 MG/0.5ML Pen Inject 12.5 mg into the skin once a week. 04/30/24   Almarie Waddell NOVAK, NP    Physical Exam    Vital Signs:  Cindy Hooper does not have vital signs available for review today.  Given telephonic nature of communication, physical exam is limited. AAOx3. NAD. Normal affect.  Speech and respirations are unlabored.  Accessory Clinical Findings    None  Assessment & Plan    1.  Preoperative Cardiovascular Risk Assessment:  Cindy Hooper perioperative risk of a major cardiac event is 0.4% according to the Revised Cardiac Risk Index (RCRI).  Therefore, she  is at low risk for perioperative complications.   Her functional capacity is good at 6.79 METs according to the Duke Activity Status Index (DASI). Recommendations: According to ACC/AHA guidelines, no further cardiovascular testing needed.  The patient may proceed to surgery at acceptable risk.    The patient was advised that if she develops new symptoms prior to surgery to contact our office to arrange for a follow-up visit, and she verbalized understanding.   A copy of this note will be routed to  requesting surgeon.  Time:   Today, I have spent 5 minutes with the patient with telehealth technology discussing medical history, symptoms, and management plan.     Orren LOISE Fabry, PA-C  05/22/2024, 3:00 PM     [1] No Known Allergies

## 2024-05-22 NOTE — H&P (Signed)
 Cindy Hooper is a 48 y.o. female G:0 presents for surgical management of a large adnexal mass. This former patient of Dr. IVAR Hoit was referred after a large pelvic mass was incidentally discovered during her annual exam. Aside from periodic twinges the patient denies any pelvic pain, changes in bowel/bladder function, back pain or dyspareunia. A pelvic ultrasound: 1015/25 revealed a normal size uterus, endometrium-9.4 mm; a cervix with a hyperechoic mass of 1 cm consistent with an endocervical polyp; left ovary with appearance of PCOS and right ovary with a simple avascular cyst measuring 17.3 x 17.1 x 11.6 cm. A subsequent ROMA test was low risk for malignancy. Given the size of the ovarian cyst, the patient has decided to proceed with surgical removal that may include removal of her right ovary.   Past Medical History  OB History: G:0  GYN History: menarche: 48 yo;  LMP: 04/26/24; Contraception condoms;   Denies history of abnormal PAP smear.  Last PAP smear 2023  Medical History: Diabetes Mellitus, B-12 and Vitamin D  Deficiency, Hypertension, Hypercholesterolemia, PCOS, Tachycardia and Rosacea.  Surgical History: none Denies history of blood transfusions  Family History: Cardiovascular Disease, Alcoholism, Liver Cancer, Stomach Cancer, Colon Cancer and Stroke.  Social History:  Married and employed as an [employer] with [employer]; Denies tobacco use and rarely consumes alcohol.   Medications: Mounjara 12.5 mg/0.5 mL  Injection subcutaneously each week (stopped 2 weeks prior to surgery) Metoprolol  Succinate ER 100 mg daily Rosuvastatin  5 mg daily B-12 2,500 mg tablet daily Vitamin D  2000 international Units daily   Allergies[1]NO KNOWN DRUG ALLERGIES  ROS: Admits to contact lenses and occasional rosacea flare but denies headache, vision changes, nasal congestion, dysphagia, tinnitus, dizziness, hoarseness, cough,  chest pain, shortness of breath, nausea, vomiting,  diarrhea,constipation,  urinary frequency, urgency  dysuria, hematuria, vaginitis symptoms, pelvic pain, swelling of joints,easy bruising,  myalgias, arthralgias, unexplained weight loss and except as is mentioned in the history of present illness, patient's review of systems is otherwise negative.    Physical Exam  Bp: 124/76;  Weight: 218.2 lbs.; Height: 5'6.5;  BMI: 34.7  Neck: supple without masses or thyromegaly Lungs: clear to auscultation Heart: regular rate and rhythm Abdomen: firm mass from pelvis extending above umbilicus that is tender with slight guarding but no rebound Pelvic:EGBUS- wnl; vagina-normal rugae; uterus-appears normal size (exam limited by habitus),  cervix without lesions or motion tenderness; adnexae-no tenderness or masses Extremities:  no clubbing, cyanosis or edema   Assesment:  Large Adnexal Mass   Disposition:  The patient was given the indication for her procedure, along with the risks of surgery to include, but not limited to: reaction to anesthesia, damage to adjacent organs (bladder, bowel, nerves and vessels), infection and excessive bleeding.  The patient verbalized understanding of these risks and has consented to proceed with a Mini Laparotomy with Removal of Right Ovarian Cyst or Possible Right Salpingectomy/ Oophorectomy at [facility] on June 13, 2024.   CSN# [id number]   Sally Reimers J. Perri, PA-C  for Dr. Nena LABOR. Rivard     [1] No Known Allergies

## 2024-06-04 ENCOUNTER — Encounter (HOSPITAL_COMMUNITY): Payer: Self-pay | Admitting: Obstetrics and Gynecology

## 2024-06-04 NOTE — Progress Notes (Signed)
 Spoke w/ via phone for pre-op interview--- Cindy Hooper needs dos----  BMP, CBC, T&S and UPT per surgeon. CBG, A1C per anesthesia.       Hooper results------ Current EKG in Epic dated 09/04/23. COVID test -----patient states asymptomatic no test needed Arrive at -------0600 NPO after MN NO Solid Food.   Pre-Surgery Ensure or G2:  Med rec completed Medications to take morning of surgery -----Metoprolol  and Crestor  Diabetic medication -----  GLP1 agonist last dose: Mounjaro  12.5mg  weekly. Last dose 05/27/24. GLP1 instructions: No further doses until after surgery.  Patient instructed no nail polish to be worn day of surgery Patient instructed to bring photo id and insurance card day of surgery Patient aware to have Driver (ride ) / caregiver    for 24 hours after surgery - Husband Cindy Hooper Patient Special Instructions ----- pre surgery Ensure ordered, pt unable to pick up prior to surgery. Pre-Op special Instructions -----  Patient verbalized understanding of instructions that were given at this phone interview. Patient denies chest pain, sob, fever, cough at the interview.

## 2024-06-13 ENCOUNTER — Other Ambulatory Visit: Payer: Self-pay

## 2024-06-13 ENCOUNTER — Ambulatory Visit (HOSPITAL_COMMUNITY)
Admission: RE | Admit: 2024-06-13 | Discharge: 2024-06-13 | Disposition: A | Discharge status: Home / Self Care | Attending: Obstetrics and Gynecology | Admitting: Obstetrics and Gynecology

## 2024-06-13 ENCOUNTER — Ambulatory Visit (HOSPITAL_COMMUNITY): Admitting: Anesthesiology

## 2024-06-13 ENCOUNTER — Encounter (HOSPITAL_COMMUNITY): Payer: Self-pay | Admitting: Obstetrics and Gynecology

## 2024-06-13 ENCOUNTER — Encounter (HOSPITAL_COMMUNITY)
Admission: RE | Disposition: A | Discharge status: Home / Self Care | Payer: Self-pay | Source: Home / Self Care | Attending: Obstetrics and Gynecology

## 2024-06-13 DIAGNOSIS — N83201 Unspecified ovarian cyst, right side: Secondary | ICD-10-CM

## 2024-06-13 DIAGNOSIS — R1900 Intra-abdominal and pelvic swelling, mass and lump, unspecified site: Secondary | ICD-10-CM | POA: Diagnosis present

## 2024-06-13 DIAGNOSIS — N83209 Unspecified ovarian cyst, unspecified side: Secondary | ICD-10-CM | POA: Diagnosis present

## 2024-06-13 DIAGNOSIS — Z79899 Other long term (current) drug therapy: Secondary | ICD-10-CM | POA: Diagnosis not present

## 2024-06-13 DIAGNOSIS — D270 Benign neoplasm of right ovary: Secondary | ICD-10-CM | POA: Insufficient documentation

## 2024-06-13 DIAGNOSIS — I10 Essential (primary) hypertension: Secondary | ICD-10-CM | POA: Diagnosis not present

## 2024-06-13 DIAGNOSIS — E669 Obesity, unspecified: Secondary | ICD-10-CM

## 2024-06-13 DIAGNOSIS — E119 Type 2 diabetes mellitus without complications: Secondary | ICD-10-CM | POA: Diagnosis not present

## 2024-06-13 DIAGNOSIS — N8312 Corpus luteum cyst of left ovary: Secondary | ICD-10-CM | POA: Insufficient documentation

## 2024-06-13 HISTORY — PX: OVARIAN CYST REMOVAL: SHX89

## 2024-06-13 HISTORY — PX: LAPAROTOMY: SHX154

## 2024-06-13 LAB — CBC
HCT: 41.3 % (ref 36.0–46.0)
Hemoglobin: 13.7 g/dL (ref 12.0–15.0)
MCH: 30.1 pg (ref 26.0–34.0)
MCHC: 33.2 g/dL (ref 30.0–36.0)
MCV: 90.8 fL (ref 80.0–100.0)
Platelets: 292 K/uL (ref 150–400)
RBC: 4.55 MIL/uL (ref 3.87–5.11)
RDW: 13.1 % (ref 11.5–15.5)
WBC: 8 K/uL (ref 4.0–10.5)
nRBC: 0 % (ref 0.0–0.2)

## 2024-06-13 LAB — GLUCOSE, CAPILLARY
Glucose-Capillary: 114 mg/dL — ABNORMAL HIGH (ref 70–99)
Glucose-Capillary: 126 mg/dL — ABNORMAL HIGH (ref 70–99)

## 2024-06-13 LAB — BASIC METABOLIC PANEL WITH GFR
Anion gap: 11 (ref 5–15)
BUN: 10 mg/dL (ref 6–20)
CO2: 19 mmol/L — ABNORMAL LOW (ref 22–32)
Calcium: 9 mg/dL (ref 8.9–10.3)
Chloride: 107 mmol/L (ref 98–111)
Creatinine, Ser: 0.8 mg/dL (ref 0.44–1.00)
GFR, Estimated: >60 mL/min
Glucose, Bld: 123 mg/dL — ABNORMAL HIGH (ref 70–99)
Potassium: 3.9 mmol/L (ref 3.5–5.1)
Sodium: 137 mmol/L (ref 135–145)

## 2024-06-13 LAB — POCT PREGNANCY, URINE: Preg Test, Ur: NEGATIVE

## 2024-06-13 LAB — TYPE AND SCREEN
ABO/RH(D): A POS
Antibody Screen: NEGATIVE

## 2024-06-13 LAB — ABO/RH: ABO/RH(D): A POS

## 2024-06-13 SURGERY — EXCISION, CYST, OVARY
Anesthesia: General | Site: Abdomen | Laterality: Bilateral

## 2024-06-13 MED ORDER — DEXAMETHASONE SOD PHOSPHATE PF 10 MG/ML IJ SOLN
INTRAMUSCULAR | Status: DC | PRN
  Administered 2024-06-13: 10 mg via INTRAVENOUS

## 2024-06-13 MED ORDER — LACTATED RINGERS IV SOLN: INTRAVENOUS | Status: DC

## 2024-06-13 MED ORDER — PROPOFOL 10 MG/ML IV BOLUS
INTRAVENOUS | Status: DC | PRN
  Administered 2024-06-13: 150 mg via INTRAVENOUS

## 2024-06-13 MED ORDER — OXYCODONE HCL 5 MG PO TABS: 5.0000 mg | ORAL_TABLET | ORAL | Status: DC | PRN

## 2024-06-13 MED ORDER — CELECOXIB 200 MG PO CAPS
ORAL_CAPSULE | ORAL | Status: AC
  Filled 2024-06-13: qty 2

## 2024-06-13 MED ORDER — METHYLENE BLUE 20 MG/2ML IV SOSY
PREFILLED_SYRINGE | INTRAVENOUS | Status: AC
  Filled 2024-06-13: qty 2

## 2024-06-13 MED ORDER — ACETAMINOPHEN 500 MG PO TABS
ORAL_TABLET | ORAL | Status: AC
  Filled 2024-06-13: qty 2

## 2024-06-13 MED ORDER — SUGAMMADEX SODIUM 200 MG/2ML IV SOLN
INTRAVENOUS | Status: DC | PRN
  Administered 2024-06-13: 200 mg via INTRAVENOUS

## 2024-06-13 MED ORDER — FENTANYL CITRATE (PF) 100 MCG/2ML IJ SOLN
INTRAMUSCULAR | Status: AC
  Filled 2024-06-13: qty 2

## 2024-06-13 MED ORDER — CEFAZOLIN SODIUM-DEXTROSE 2-4 GM/100ML-% IV SOLN
INTRAVENOUS | Status: AC
  Filled 2024-06-13: qty 100

## 2024-06-13 MED ORDER — VASOPRESSIN 20 UNIT/ML IV SOLN
INTRAVENOUS | Status: AC
  Filled 2024-06-13: qty 1

## 2024-06-13 MED ORDER — ROCURONIUM BROMIDE 10 MG/ML (PF) SYRINGE
PREFILLED_SYRINGE | INTRAVENOUS | Status: DC | PRN
  Administered 2024-06-13: 20 mg via INTRAVENOUS
  Administered 2024-06-13: 60 mg via INTRAVENOUS
  Administered 2024-06-13: 20 mg via INTRAVENOUS

## 2024-06-13 MED ORDER — ONDANSETRON HCL 4 MG/2ML IJ SOLN
INTRAMUSCULAR | Status: DC | PRN
  Administered 2024-06-13: 4 mg via INTRAVENOUS

## 2024-06-13 MED ORDER — GABAPENTIN 300 MG PO CAPS
300.0000 mg | ORAL_CAPSULE | ORAL | Status: AC
  Administered 2024-06-13: 300 mg via ORAL

## 2024-06-13 MED ORDER — BUPIVACAINE LIPOSOME 1.3 % IJ SUSP
INTRAMUSCULAR | Status: AC
  Filled 2024-06-13: qty 20

## 2024-06-13 MED ORDER — SCOPOLAMINE 1 MG/3DAYS TD PT72
1.0000 | MEDICATED_PATCH | TRANSDERMAL | Status: DC
  Administered 2024-06-13: 1 mg via TRANSDERMAL

## 2024-06-13 MED ORDER — MIDAZOLAM HCL (PF) 2 MG/2ML IJ SOLN
INTRAMUSCULAR | Status: DC | PRN
  Administered 2024-06-13: 2 mg via INTRAVENOUS

## 2024-06-13 MED ORDER — ONDANSETRON HCL 4 MG PO TABS: 4.0000 mg | ORAL_TABLET | Freq: Four times a day (QID) | ORAL | Status: DC | PRN

## 2024-06-13 MED ORDER — SODIUM CHLORIDE (PF) 0.9 % IJ SOLN
INTRAMUSCULAR | Status: AC
  Filled 2024-06-13: qty 100

## 2024-06-13 MED ORDER — MENTHOL 3 MG MT LOZG: 1.0000 | LOZENGE | OROMUCOSAL | Status: DC | PRN

## 2024-06-13 MED ORDER — BUPIVACAINE HCL (PF) 0.25 % IJ SOLN
INTRAMUSCULAR | Status: AC
  Filled 2024-06-13: qty 30

## 2024-06-13 MED ORDER — CHLORHEXIDINE GLUCONATE 0.12 % MT SOLN
OROMUCOSAL | Status: AC
  Filled 2024-06-13: qty 15

## 2024-06-13 MED ORDER — MIDAZOLAM HCL 2 MG/2ML IJ SOLN
INTRAMUSCULAR | Status: AC
  Filled 2024-06-13: qty 2

## 2024-06-13 MED ORDER — POVIDONE-IODINE 10 % EX SWAB: 2.0000 | Freq: Once | CUTANEOUS | Status: DC

## 2024-06-13 MED ORDER — ORAL CARE MOUTH RINSE: 15.0000 mL | Freq: Once | OROMUCOSAL | Status: AC

## 2024-06-13 MED ORDER — ACETAMINOPHEN 500 MG PO TABS
1000.0000 mg | ORAL_TABLET | ORAL | Status: AC
  Administered 2024-06-13: 1000 mg via ORAL

## 2024-06-13 MED ORDER — HYDROMORPHONE HCL 1 MG/ML IJ SOLN
0.2500 mg | INTRAMUSCULAR | Status: DC | PRN
  Administered 2024-06-13: 0.5 mg via INTRAVENOUS

## 2024-06-13 MED ORDER — KETAMINE HCL 50 MG/5ML IJ SOSY
PREFILLED_SYRINGE | INTRAMUSCULAR | Status: DC | PRN
  Administered 2024-06-13: 50 mg via INTRAVENOUS

## 2024-06-13 MED ORDER — LIDOCAINE 2% (20 MG/ML) 5 ML SYRINGE
INTRAMUSCULAR | Status: DC | PRN
  Administered 2024-06-13: 100 mg via INTRAVENOUS

## 2024-06-13 MED ORDER — AMISULPRIDE (ANTIEMETIC) 5 MG/2ML IV SOLN: 10.0000 mg | Freq: Once | INTRAVENOUS | Status: DC | PRN

## 2024-06-13 MED ORDER — ACETAMINOPHEN 500 MG PO TABS
1000.0000 mg | ORAL_TABLET | Freq: Four times a day (QID) | ORAL | Status: DC
  Administered 2024-06-13: 1000 mg via ORAL
  Filled 2024-06-13: qty 2

## 2024-06-13 MED ORDER — CELECOXIB 200 MG PO CAPS
400.0000 mg | ORAL_CAPSULE | ORAL | Status: AC
  Administered 2024-06-13: 400 mg via ORAL

## 2024-06-13 MED ORDER — FLUORESCEIN SODIUM 10 % IV SOLN
INTRAVENOUS | Status: AC
  Filled 2024-06-13: qty 5

## 2024-06-13 MED ORDER — ENSURE PRE-SURGERY PO LIQD: 592.0000 mL | Freq: Once | ORAL | Status: DC

## 2024-06-13 MED ORDER — PHENYLEPHRINE 80 MCG/ML (10ML) SYRINGE FOR IV PUSH (FOR BLOOD PRESSURE SUPPORT)
PREFILLED_SYRINGE | INTRAVENOUS | Status: DC | PRN
  Administered 2024-06-13 (×3): 80 ug via INTRAVENOUS
  Administered 2024-06-13: 40 ug via INTRAVENOUS

## 2024-06-13 MED ORDER — CHLORHEXIDINE GLUCONATE 0.12 % MT SOLN
15.0000 mL | Freq: Once | OROMUCOSAL | Status: AC
  Administered 2024-06-13: 15 mL via OROMUCOSAL

## 2024-06-13 MED ORDER — BUPIVACAINE LIPOSOME 1.3 % IJ SUSP
INTRAMUSCULAR | Status: DC | PRN
  Administered 2024-06-13: 40 mL via SURGICAL_CAVITY

## 2024-06-13 MED ORDER — ENSURE PRE-SURGERY PO LIQD: 296.0000 mL | Freq: Once | ORAL | Status: DC

## 2024-06-13 MED ORDER — HYDROMORPHONE HCL 1 MG/ML IJ SOLN
INTRAMUSCULAR | Status: AC
  Filled 2024-06-13: qty 1

## 2024-06-13 MED ORDER — SCOPOLAMINE 1 MG/3DAYS TD PT72
MEDICATED_PATCH | TRANSDERMAL | Status: AC
  Filled 2024-06-13: qty 1

## 2024-06-13 MED ORDER — GABAPENTIN 300 MG PO CAPS
ORAL_CAPSULE | ORAL | Status: AC
  Filled 2024-06-13: qty 1

## 2024-06-13 MED ORDER — FENTANYL CITRATE (PF) 250 MCG/5ML IJ SOLN
INTRAMUSCULAR | Status: DC | PRN
  Administered 2024-06-13: 100 ug via INTRAVENOUS
  Administered 2024-06-13: 50 ug via INTRAVENOUS

## 2024-06-13 MED ORDER — SIMETHICONE 80 MG PO CHEW: 80.0000 mg | CHEWABLE_TABLET | Freq: Four times a day (QID) | ORAL | Status: DC | PRN

## 2024-06-13 MED ORDER — PROPOFOL 10 MG/ML IV BOLUS
INTRAVENOUS | Status: AC
  Filled 2024-06-13: qty 20

## 2024-06-13 MED ORDER — 0.9 % SODIUM CHLORIDE (POUR BTL) OPTIME
TOPICAL | Status: DC | PRN
  Administered 2024-06-13 (×2): 1000 mL

## 2024-06-13 MED ORDER — INSULIN ASPART 100 UNIT/ML IJ SOLN: 0.0000 [IU] | INTRAMUSCULAR | Status: DC | PRN

## 2024-06-13 MED ORDER — KETAMINE HCL 50 MG/5ML IJ SOSY
PREFILLED_SYRINGE | INTRAMUSCULAR | Status: AC
  Filled 2024-06-13: qty 5

## 2024-06-13 MED ORDER — CEFAZOLIN SODIUM-DEXTROSE 2-4 GM/100ML-% IV SOLN
2.0000 g | INTRAVENOUS | Status: AC
  Administered 2024-06-13: 2 g via INTRAVENOUS

## 2024-06-13 MED ORDER — PHENYLEPHRINE HCL-NACL 20-0.9 MG/250ML-% IV SOLN
INTRAVENOUS | Status: DC | PRN
  Administered 2024-06-13: 20 ug/min via INTRAVENOUS

## 2024-06-13 MED ORDER — DOCUSATE SODIUM 100 MG PO CAPS
100.0000 mg | ORAL_CAPSULE | Freq: Two times a day (BID) | ORAL | Status: DC
  Administered 2024-06-13: 100 mg via ORAL
  Filled 2024-06-13: qty 1

## 2024-06-13 MED ORDER — ONDANSETRON HCL 4 MG/2ML IJ SOLN: 4.0000 mg | Freq: Four times a day (QID) | INTRAMUSCULAR | Status: DC | PRN

## 2024-06-13 MED ORDER — IBUPROFEN 600 MG PO TABS
600.0000 mg | ORAL_TABLET | Freq: Four times a day (QID) | ORAL | Status: DC
  Administered 2024-06-13: 600 mg via ORAL
  Filled 2024-06-13: qty 1

## 2024-06-13 SURGICAL SUPPLY — 39 items
ADAPTER CATH SYR TO TUBING 38M (ADAPTER) IMPLANT
BARRIER ADHS 3X4 INTERCEED (GAUZE/BANDAGES/DRESSINGS) IMPLANT
BENZOIN TINCTURE PRP APPL 2/3 (GAUZE/BANDAGES/DRESSINGS) IMPLANT
CLSR STERI-STRIP ANTIMIC 1/2X4 (GAUZE/BANDAGES/DRESSINGS) IMPLANT
COVER MAYO STAND STRL (DRAPES) ×2 IMPLANT
CURETTE PIPELLE ENDOMTRL SUCTN (MISCELLANEOUS) IMPLANT
DRAPE CESAREAN BIRTH W POUCH (DRAPES) ×2 IMPLANT
DRSG OPSITE POSTOP 4X10 (GAUZE/BANDAGES/DRESSINGS) ×2 IMPLANT
DRSG OPSITE POSTOP 4X6 (GAUZE/BANDAGES/DRESSINGS) IMPLANT
DURAPREP 26ML APPLICATOR (WOUND CARE) ×2 IMPLANT
ELECT NEEDLE TIP 2.8 STRL (NEEDLE) ×2 IMPLANT
GAUZE 4X4 16PLY ~~LOC~~+RFID DBL (SPONGE) IMPLANT
GAUZE SPONGE 4X4 12PLY STRL LF (GAUZE/BANDAGES/DRESSINGS) ×4 IMPLANT
GLOVE BIOGEL PI IND STRL 7.0 (GLOVE) ×4 IMPLANT
GLOVE BIOGEL PI MICRO STRL 6.5 (GLOVE) ×2 IMPLANT
GOWN STRL REUS W/ TWL LRG LVL3 (GOWN DISPOSABLE) ×6 IMPLANT
HEMOSTAT SURGICEL 4X8 (HEMOSTASIS) IMPLANT
KIT TURNOVER KIT B (KITS) ×2 IMPLANT
NEEDLE HYPO 22X1.5 SAFETY MO (MISCELLANEOUS) ×2 IMPLANT
PACK ABDOMINAL GYN (CUSTOM PROCEDURE TRAY) ×2 IMPLANT
PAD ARMBOARD POSITIONER FOAM (MISCELLANEOUS) ×2 IMPLANT
PAD OB MATERNITY 11 LF (PERSONAL CARE ITEMS) ×2 IMPLANT
PENCIL SMOKE EVACUATOR (MISCELLANEOUS) ×2 IMPLANT
RETRACTOR WND ALEXIS 18 MED (MISCELLANEOUS) IMPLANT
SEAL UNIV 5-12 XI (MISCELLANEOUS) IMPLANT
SOLN 0.9% NACL POUR BTL 1000ML (IV SOLUTION) ×2 IMPLANT
SPIKE FLUID TRANSFER (MISCELLANEOUS) ×2 IMPLANT
SPONGE T-LAP 18X18 ~~LOC~~+RFID (SPONGE) IMPLANT
SUT MNCRL AB 3-0 PS2 27 (SUTURE) ×2 IMPLANT
SUT VIC AB 0 CT1 27XBRD ANBCTR (SUTURE) ×12 IMPLANT
SUT VIC AB 1 CT1 36 (SUTURE) ×2 IMPLANT
SUT VIC AB 2-0 CT1 TAPERPNT 27 (SUTURE) ×8 IMPLANT
SUT VIC AB 3-0 CT1 27XBRD (SUTURE) IMPLANT
SUT VIC AB 3-0 SH 27X BRD (SUTURE) IMPLANT
SYR 30ML LL (SYRINGE) IMPLANT
SYR CONTROL 10ML LL (SYRINGE) ×2 IMPLANT
TOWEL GREEN STERILE FF (TOWEL DISPOSABLE) ×4 IMPLANT
TRAY FOLEY W/BAG SLVR 14FR (SET/KITS/TRAYS/PACK) ×2 IMPLANT
TROCAR Z THREAD OPTICAL 12X100 (TROCAR) IMPLANT

## 2024-06-13 NOTE — Discharge Instructions (Addendum)
 Call [facility] at [phone] if:   You have a temperature greater than or equal to 100.4 degrees Farenheit orally You have pain that is not made better by the pain medication given and taken as directed You have excessive bleeding or problems urinating  Take Colace (Docusate Sodium /Stool Softener) 100 mg 2-3 times daily while taking narcotic pain medicine to avoid constipation or until bowel movements are regular.  Take Ibuprofen  600 mg with food and Acetaminophen  500 mg  (#2 tablets) every 6 hours for 5 days then as needed for post operative pain  You may drive after 2 weeks You may walk up steps  You may shower tomorrow You may resume a regular diet  Keep incisions clean and dry Do not lift over 15 pounds for 6 weeks Avoid anything in vagina for 6 weeks    Follow-up appointment: 06/27/24 at 1:30 pm with Dr Darcel

## 2024-06-13 NOTE — Anesthesia Procedure Notes (Signed)
 Procedure Name: Intubation Date/Time: 06/13/2024 8:17 AM  Performed by: Chaney Ozell CROME, CRNAPre-anesthesia Checklist: Patient identified, Emergency Drugs available, Suction available and Patient being monitored Patient Re-evaluated:Patient Re-evaluated prior to induction Oxygen Delivery Method: Circle System Utilized Preoxygenation: Pre-oxygenation with 100% oxygen Induction Type: IV induction Ventilation: Mask ventilation without difficulty Laryngoscope Size: Mac and 3 Grade View: Grade I Tube type: Oral Tube size: 7.0 mm Number of attempts: 1 Airway Equipment and Method: Stylet and Oral airway Placement Confirmation: ETT inserted through vocal cords under direct vision, positive ETCO2 and breath sounds checked- equal and bilateral Secured at: 21 cm Tube secured with: Tape Dental Injury: Teeth and Oropharynx as per pre-operative assessment

## 2024-06-13 NOTE — Interval H&P Note (Signed)
 History and Physical Interval Note:  06/13/2024 7:03 AM  Cindy Hooper  has presented today for surgery, with the diagnosis of LARGE PELVIC MASS.  The various methods of treatment have been discussed with the patient and family. After consideration of risks, benefits and other options for treatment, the patient has consented to  Procedures with comments: EXCISION, CYST, OVARY (N/A) - OVARAIN CYSTECTOMY BI MINI LAPAROTOMY PLUS MINUS OOPHORECTOMY as a surgical intervention.  The patient's history has been reviewed, patient examined, no change in status, stable for surgery.  I have reviewed the patient's chart and labs.  Questions were answered to the patient's satisfaction.     Nena A Shanequa [place]ight

## 2024-06-13 NOTE — Progress Notes (Signed)
" °   06/13/24 1649  Departure Condition  Departure Condition Good  Mobility at [facility]  Patient/Caregiver Teaching Teach Back Method Used;Discharge instructions reviewed;Prescriptions reviewed;Pain management discussed;Admission discussed;Follow-up care reviewed;Medications discussed;Patient/caregiver verbalized understanding;Educated about hypertension in pregnancy  Departure Mode With significant other   Patient alert and oriented x4, VS and pain stable.  "

## 2024-06-13 NOTE — Anesthesia Preprocedure Evaluation (Addendum)
"                                    Anesthesia Evaluation  Patient identified by MRN, date of birth, ID band Patient awake    Reviewed: Allergy & Precautions, NPO status , Patient's Chart, lab work & pertinent test results, reviewed documented beta blocker date and time   Airway Mallampati: I  TM Distance: >3 FB Neck ROM: Full    Dental no notable dental hx. (+) Teeth Intact, Dental Advisory Given   Pulmonary neg pulmonary ROS   Pulmonary exam normal breath sounds clear to auscultation       Cardiovascular hypertension, Pt. on home beta blockers and Pt. on medications Normal cardiovascular exam Rhythm:Regular Rate:Normal     Neuro/Psych negative neurological ROS  negative psych ROS   GI/Hepatic negative GI ROS, Neg liver ROS,,,  Endo/Other  diabetes, Type 2    Renal/GU negative Renal ROS  negative genitourinary   Musculoskeletal negative musculoskeletal ROS (+)    Abdominal   Peds  Hematology negative hematology ROS (+)   Anesthesia Other Findings   Reproductive/Obstetrics                              Anesthesia Physical Anesthesia Plan  ASA: 2  Anesthesia Plan: General   Post-op Pain Management: Tylenol  PO (pre-op)*, Ketamine  IV*, Dilaudid  IV and Toradol IV (intra-op)*   Induction: Intravenous  PONV Risk Score and Plan: 3 and Midazolam , Dexamethasone  and Ondansetron   Airway Management Planned: Oral ETT  Additional Equipment:   Intra-op Plan:   Post-operative Plan: Extubation in OR  Informed Consent: I have reviewed the patients History and Physical, chart, labs and discussed the procedure including the risks, benefits and alternatives for the proposed anesthesia with the patient or authorized representative who has indicated his/her understanding and acceptance.     Dental advisory given  Plan Discussed with: CRNA  Anesthesia Plan Comments:          Anesthesia Quick Evaluation  "

## 2024-06-13 NOTE — Transfer of Care (Signed)
 Immediate Anesthesia Transfer of Care Note  Patient: Cindy Hooper  Procedure(s) Performed: EXCISION, CYST, OVARY/ BILATERAL OVARIAN CYSTECTOMY (Bilateral: Abdomen) MINI LAPAROTOMY (Abdomen)  Patient Location: PACU  Anesthesia Type:General  Level of Consciousness: drowsy, patient cooperative, and responds to stimulation  Airway & Oxygen Therapy: Patient connected to face mask oxygen  Post-op Assessment: Report given to RN and Post -op Vital signs reviewed and stable  Post vital signs: Reviewed and stable  Last Vitals:  Vitals Value Taken Time  BP 112/66 06/13/24 10:27  Temp 37.3 C 06/13/24 10:27  Pulse 95 06/13/24 10:29  Resp 17 06/13/24 10:29  SpO2 95 % 06/13/24 10:29  Vitals shown include unfiled device data.  Last Pain:  Vitals:   06/13/24 0600  TempSrc: Oral  PainSc: 0-No pain      Patients Stated Pain Goal: 6 (06/13/24 0600)  Complications: There were no known notable events for this encounter.

## 2024-06-13 NOTE — Plan of Care (Signed)
" °  Problem: Education: Goal: Knowledge of the prescribed therapeutic regimen will improve Outcome: Adequate for Discharge   Problem: Bowel/Gastric: Goal: Gastrointestinal status for postoperative course will improve Outcome: Adequate for Discharge   Problem: Cardiac: Goal: Ability to maintain an adequate cardiac output Outcome: Adequate for Discharge Goal: Will show no evidence of cardiac arrhythmias Outcome: Adequate for Discharge   Problem: Nutritional: Goal: Will attain and maintain optimal nutritional status Outcome: Adequate for Discharge   Problem: Neurological: Goal: Will regain or maintain usual level of consciousness Outcome: Adequate for Discharge   Problem: Clinical Measurements: Goal: Ability to maintain clinical measurements within normal limits Outcome: Adequate for Discharge Goal: Postoperative complications will be avoided or minimized Outcome: Adequate for Discharge   Problem: Respiratory: Goal: Will regain and/or maintain adequate ventilation Outcome: Adequate for Discharge Goal: Respiratory status will improve Outcome: Adequate for Discharge   Problem: Skin Integrity: Goal: Demonstrates signs of wound healing without infection Outcome: Adequate for Discharge   Problem: Urinary Elimination: Goal: Will remain free from infection Outcome: Adequate for Discharge Goal: Ability to achieve and maintain adequate urine output Outcome: Adequate for Discharge   Problem: Education: Goal: Knowledge of General Education information will improve Description: Including pain rating scale, medication(s)/side effects and non-pharmacologic comfort measures Outcome: Adequate for Discharge   Problem: Health Behavior/Discharge Planning: Goal: Ability to manage health-related needs will improve Outcome: Adequate for Discharge   Problem: Clinical Measurements: Goal: Ability to maintain clinical measurements within normal limits will improve Outcome: Adequate for  Discharge Goal: Will remain free from infection Outcome: Adequate for Discharge Goal: Diagnostic test results will improve Outcome: Adequate for Discharge Goal: Respiratory complications will improve Outcome: Adequate for Discharge Goal: Cardiovascular complication will be avoided Outcome: Adequate for Discharge   Problem: Activity: Goal: Risk for activity intolerance will decrease Outcome: Adequate for Discharge   Problem: Nutrition: Goal: Adequate nutrition will be maintained Outcome: Adequate for Discharge   Problem: Coping: Goal: Level of anxiety will decrease Outcome: Adequate for Discharge   Problem: Elimination: Goal: Will not experience complications related to bowel motility Outcome: Adequate for Discharge Goal: Will not experience complications related to urinary retention Outcome: Adequate for Discharge   Problem: Pain Managment: Goal: General experience of comfort will improve and/or be controlled Outcome: Adequate for Discharge   Problem: Safety: Goal: Ability to remain free from injury will improve Outcome: Adequate for Discharge   Problem: Skin Integrity: Goal: Risk for impaired skin integrity will decrease Outcome: Adequate for Discharge   Problem: Education: Goal: Knowledge of the prescribed therapeutic regimen will improve Outcome: Adequate for Discharge Goal: Understanding of sexual limitations or changes related to disease process or condition will improve Outcome: Adequate for Discharge Goal: Individualized Educational Video(s) Outcome: Adequate for Discharge   Problem: Self-Concept: Goal: Communication of feelings regarding changes in body function or appearance will improve Outcome: Adequate for Discharge   Problem: Skin Integrity: Goal: Demonstration of wound healing without infection will improve Outcome: Adequate for Discharge   "

## 2024-06-13 NOTE — Op Note (Signed)
 Preoperative diagnosis: Large pelvic mass  Postoperative diagnosis: Right ovarian cyst (18 cm) and left ovarian cyst (5 cm)  Anesthesia: General  Anesthesiologist: Dr. Niels  Procedure: Bilateral ovarian cystectomy with mini-laparotomy  Surgeon: Dr. Darcel  Asst.: Madolyn Monte PA-C  Estimated blood loss: 50 cc  Procedure:  After being informed of the planned procedure with possible complications including but not limited to bleeding, infection, injury to other organs, informed consent is obtained and patient is taken to or #6. She is given general anesthesia with endotracheal intubation without complications.She is placed in the dorsal decubitus position, prepped and draped in a sterile fashion. A Foley catheter is inserted in her bladder.  We infiltrate the supra-pubic area using 20 cc of  Exparel  and 20 cc of Marcaine  0.25 and perform a 5 cm Pfannenstiel incision which is brought down sharply to the fascia. The fascia is incised in a low transverse fashion. Linea alba is dissected and peritoneum is entered bluntly. An Alexis retractor is positioned easily and bowels are retracted with abdominal packings.  Observation: The uterus is normal. Right ovarian cyst measuring 18 cm with smooth surface and mobile. Left ovarian cyst measuring 5 cm. Normal tubes, anterior cul-de-sac and posterior cul-de-sac.  A small incision is performed on the surface of the right ovarian cyst to allow insertion of a 12 mm trocar with large suction. The cyst is rapidly aspirated and exteriorized from the abdominal cavity. The incision is extended which allows for complete excision of the cyst wall by traction and contra-traction. The hilum of the ovarian bed is closed with multiple layers of circumferential running sutures of 3-0 Vicryl until hemostasis is deemed adequate. We proceed in the exact same fashion for the 5 cm left ovarian cyst (likely corpus luteum).   We irrigated profusely with warm saline and  note satisfactory hemostasis.   All sponges and retractors are removed. Under fascia hemostasis is completed with cauterization.The fascia is closed with 2 running sutures of 1 Vicryl meeting midline. The wound is irrigated with warm saline and hemostasis is completed with cauterization. The skin is closed with a subcuticular suture of 3-0 Monocryl and Steri-Strips.  Instruments and sponge count is complete 2.   Estimated blood loss is 50 cc.   Dr Darcel was present and scrubbed at all times. Surgical assistance was required due to the complexity of the anatomy and procedure.  The procedure is well tolerated by the patient is taken to recovery room in a well and stable condition.  Specimen: 2 ovarian cyst walls  sent to pathology

## 2024-06-14 ENCOUNTER — Encounter (HOSPITAL_COMMUNITY): Payer: Self-pay | Admitting: Obstetrics and Gynecology

## 2024-06-14 NOTE — Anesthesia Postprocedure Evaluation (Signed)
"   Anesthesia Post Note  Patient: Cindy Hooper  Procedure(s) Performed: EXCISION, CYST, OVARY/ BILATERAL OVARIAN CYSTECTOMY (Bilateral: Abdomen) MINI LAPAROTOMY (Abdomen)     Patient location during evaluation: PACU Anesthesia Type: General Level of consciousness: awake and alert Pain management: pain level controlled Vital Signs Assessment: post-procedure vital signs reviewed and stable Respiratory status: spontaneous breathing, nonlabored ventilation, respiratory function stable and patient connected to nasal cannula oxygen Cardiovascular status: blood pressure returned to baseline and stable Postop Assessment: no apparent nausea or vomiting Anesthetic complications: no   There were no known notable events for this encounter.  Last Vitals:  Vitals:   06/13/24 1249 06/13/24 1622  BP: 107/66 108/60  Pulse: 91 (!) 101  Resp: 16 17  Temp: 36.8 C 37.3 C  SpO2: 97% 98%    Last Pain:  Vitals:   06/13/24 1622  TempSrc: Oral  PainSc:                  Farrin Shadle L Kamsiyochukwu Buist      "

## 2024-06-17 LAB — SURGICAL PATHOLOGY

## 2024-06-25 ENCOUNTER — Other Ambulatory Visit: Payer: Self-pay | Admitting: Family Medicine

## 2024-06-25 DIAGNOSIS — E669 Obesity, unspecified: Secondary | ICD-10-CM

## 2024-06-25 DIAGNOSIS — E282 Polycystic ovarian syndrome: Secondary | ICD-10-CM

## 2024-09-30 ENCOUNTER — Encounter: Admitting: Family Medicine
